# Patient Record
Sex: Male | Born: 1955 | Race: White | Hispanic: No | Marital: Married | Smoking: Former smoker
Health system: Southern US, Community
[De-identification: ages and names within clinical notes are randomized; demographics above are authoritative.]

## PROBLEM LIST (undated history)

## (undated) ENCOUNTER — Ambulatory Visit: Admission: EM

## (undated) DIAGNOSIS — M1990 Unspecified osteoarthritis, unspecified site: Secondary | ICD-10-CM

## (undated) DIAGNOSIS — F32A Depression, unspecified: Secondary | ICD-10-CM

## (undated) DIAGNOSIS — F419 Anxiety disorder, unspecified: Secondary | ICD-10-CM

## (undated) DIAGNOSIS — F329 Major depressive disorder, single episode, unspecified: Secondary | ICD-10-CM

## (undated) HISTORY — DX: Depression, unspecified: F32.A

## (undated) HISTORY — DX: Major depressive disorder, single episode, unspecified: F32.9

## (undated) HISTORY — DX: Unspecified osteoarthritis, unspecified site: M19.90

## (undated) HISTORY — PX: FOOT SURGERY: SHX648

## (undated) HISTORY — PX: BACK SURGERY: SHX140

## (undated) HISTORY — DX: Anxiety disorder, unspecified: F41.9

---

## 1998-03-28 ENCOUNTER — Other Ambulatory Visit: Admission: RE | Admit: 1998-03-28 | Discharge: 1998-03-28 | Payer: Self-pay | Admitting: Orthopedic Surgery

## 2001-01-03 ENCOUNTER — Emergency Department (HOSPITAL_COMMUNITY): Admission: EM | Admit: 2001-01-03 | Discharge: 2001-01-03 | Payer: Self-pay

## 2001-02-11 ENCOUNTER — Emergency Department (HOSPITAL_COMMUNITY): Admission: EM | Admit: 2001-02-11 | Discharge: 2001-02-11 | Payer: Self-pay | Admitting: Emergency Medicine

## 2001-02-11 ENCOUNTER — Encounter: Payer: Self-pay | Admitting: Emergency Medicine

## 2001-05-16 ENCOUNTER — Emergency Department (HOSPITAL_COMMUNITY): Admission: EM | Admit: 2001-05-16 | Discharge: 2001-05-16 | Payer: Self-pay | Admitting: Emergency Medicine

## 2001-07-03 ENCOUNTER — Encounter: Payer: Self-pay | Admitting: Internal Medicine

## 2001-07-03 ENCOUNTER — Ambulatory Visit (HOSPITAL_COMMUNITY): Admission: RE | Admit: 2001-07-03 | Discharge: 2001-07-03 | Payer: Self-pay | Admitting: Internal Medicine

## 2001-07-16 ENCOUNTER — Encounter: Admission: RE | Admit: 2001-07-16 | Discharge: 2001-07-16 | Payer: Self-pay | Admitting: Neurosurgery

## 2001-07-16 ENCOUNTER — Encounter: Payer: Self-pay | Admitting: Neurosurgery

## 2001-07-31 ENCOUNTER — Encounter: Payer: Self-pay | Admitting: Neurosurgery

## 2001-07-31 ENCOUNTER — Encounter: Admission: RE | Admit: 2001-07-31 | Discharge: 2001-07-31 | Payer: Self-pay | Admitting: Neurosurgery

## 2001-08-14 ENCOUNTER — Encounter: Payer: Self-pay | Admitting: Neurosurgery

## 2001-08-14 ENCOUNTER — Encounter: Admission: RE | Admit: 2001-08-14 | Discharge: 2001-08-14 | Payer: Self-pay | Admitting: Neurosurgery

## 2001-11-08 ENCOUNTER — Encounter: Payer: Self-pay | Admitting: *Deleted

## 2001-11-08 ENCOUNTER — Emergency Department (HOSPITAL_COMMUNITY): Admission: EM | Admit: 2001-11-08 | Discharge: 2001-11-09 | Payer: Self-pay | Admitting: *Deleted

## 2002-07-18 ENCOUNTER — Emergency Department (HOSPITAL_COMMUNITY): Admission: EM | Admit: 2002-07-18 | Discharge: 2002-07-19 | Payer: Self-pay | Admitting: Emergency Medicine

## 2002-07-18 ENCOUNTER — Encounter: Payer: Self-pay | Admitting: Emergency Medicine

## 2003-09-09 ENCOUNTER — Emergency Department (HOSPITAL_COMMUNITY): Admission: EM | Admit: 2003-09-09 | Discharge: 2003-09-09 | Payer: Self-pay | Admitting: Emergency Medicine

## 2004-11-09 ENCOUNTER — Ambulatory Visit (HOSPITAL_COMMUNITY): Admission: RE | Admit: 2004-11-09 | Discharge: 2004-11-09 | Payer: Self-pay | Admitting: Orthopaedic Surgery

## 2004-11-29 ENCOUNTER — Encounter: Admission: RE | Admit: 2004-11-29 | Discharge: 2004-11-29 | Payer: Self-pay | Admitting: Orthopaedic Surgery

## 2004-12-21 ENCOUNTER — Encounter: Admission: RE | Admit: 2004-12-21 | Discharge: 2004-12-21 | Payer: Self-pay | Admitting: Orthopaedic Surgery

## 2005-05-12 ENCOUNTER — Emergency Department (HOSPITAL_COMMUNITY): Admission: EM | Admit: 2005-05-12 | Discharge: 2005-05-12 | Payer: Self-pay | Admitting: Emergency Medicine

## 2005-09-26 ENCOUNTER — Emergency Department (HOSPITAL_COMMUNITY): Admission: EM | Admit: 2005-09-26 | Discharge: 2005-09-26 | Payer: Self-pay | Admitting: Emergency Medicine

## 2005-09-26 ENCOUNTER — Encounter: Admission: RE | Admit: 2005-09-26 | Discharge: 2005-09-26 | Payer: Self-pay | Admitting: Orthopaedic Surgery

## 2005-10-18 ENCOUNTER — Encounter: Admission: RE | Admit: 2005-10-18 | Discharge: 2005-10-18 | Payer: Self-pay | Admitting: Orthopaedic Surgery

## 2005-11-11 ENCOUNTER — Encounter: Admission: RE | Admit: 2005-11-11 | Discharge: 2005-11-11 | Payer: Self-pay | Admitting: Orthopaedic Surgery

## 2007-02-18 ENCOUNTER — Emergency Department (HOSPITAL_COMMUNITY): Admission: EM | Admit: 2007-02-18 | Discharge: 2007-02-18 | Payer: Self-pay | Admitting: Emergency Medicine

## 2007-03-16 ENCOUNTER — Ambulatory Visit (HOSPITAL_COMMUNITY): Admission: RE | Admit: 2007-03-16 | Discharge: 2007-03-16 | Payer: Self-pay | Admitting: Orthopedic Surgery

## 2007-03-19 ENCOUNTER — Ambulatory Visit (HOSPITAL_COMMUNITY): Admission: RE | Admit: 2007-03-19 | Discharge: 2007-03-20 | Payer: Self-pay | Admitting: Orthopedic Surgery

## 2007-10-14 ENCOUNTER — Ambulatory Visit (HOSPITAL_COMMUNITY): Admission: RE | Admit: 2007-10-14 | Discharge: 2007-10-14 | Payer: Self-pay | Admitting: Gastroenterology

## 2007-10-14 ENCOUNTER — Encounter (INDEPENDENT_AMBULATORY_CARE_PROVIDER_SITE_OTHER): Payer: Self-pay | Admitting: Gastroenterology

## 2007-12-29 ENCOUNTER — Ambulatory Visit (HOSPITAL_COMMUNITY): Admission: RE | Admit: 2007-12-29 | Discharge: 2007-12-29 | Payer: Self-pay | Admitting: Orthopaedic Surgery

## 2008-02-22 ENCOUNTER — Emergency Department (HOSPITAL_COMMUNITY): Admission: EM | Admit: 2008-02-22 | Discharge: 2008-02-22 | Payer: Self-pay | Admitting: Emergency Medicine

## 2008-03-06 IMAGING — CT CT CHEST W/ CM
2 of 3 series · 16 of 36 positions shown, 20 images · non-contrast
Comparison: No prior CT. Chest x-ray from 03/12/2007 has been reviewed.

CLINICAL DATA: Left lower lobe mass seen on chest x-ray from 03/12/2007
TECHNIQUE: Multidetector CT imaging of the chest was performed following the
standard protocol during bolus administration of intravenous contrast.

[Series 2: chest routine 5.0 b40f · axial · 0.81mm/px · z∈[-395,-35]mm · 13 of 81 slices shown, 17 images]
[im 6/81  mediastinal]
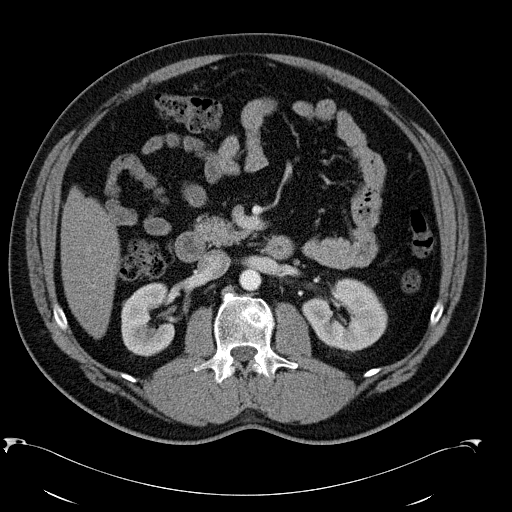
[im 6/81  lung]
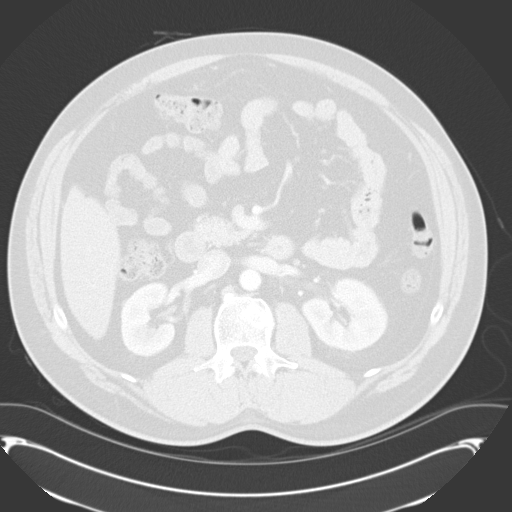
[im 12/81  lung]
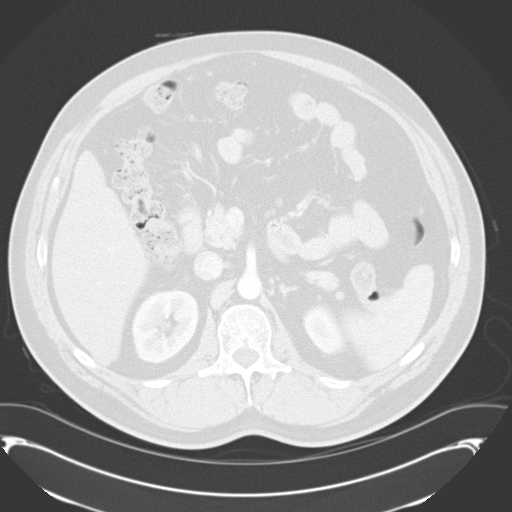
[im 18/81  lung]
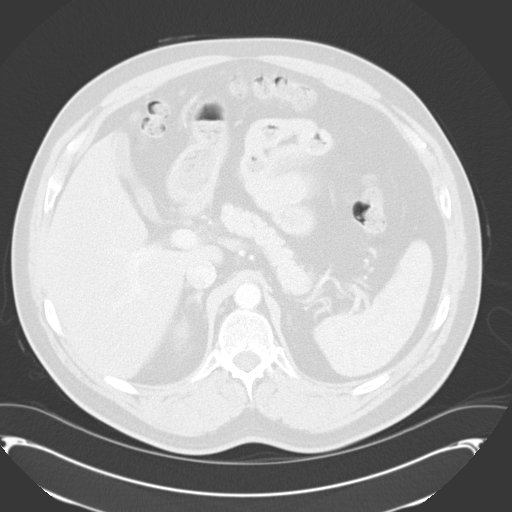
[im 24/81  lung]
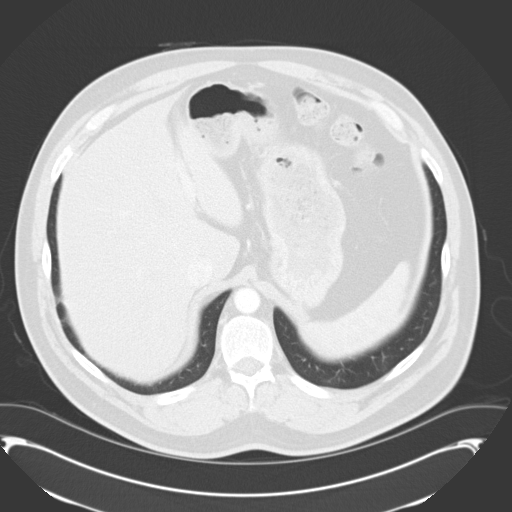
[im 30/81  mediastinal]
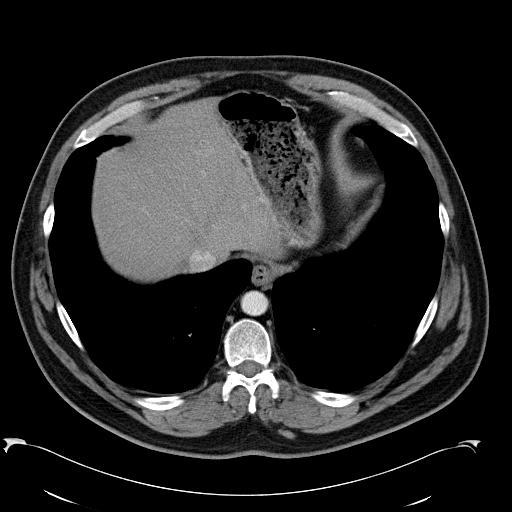
[im 30/81  lung]
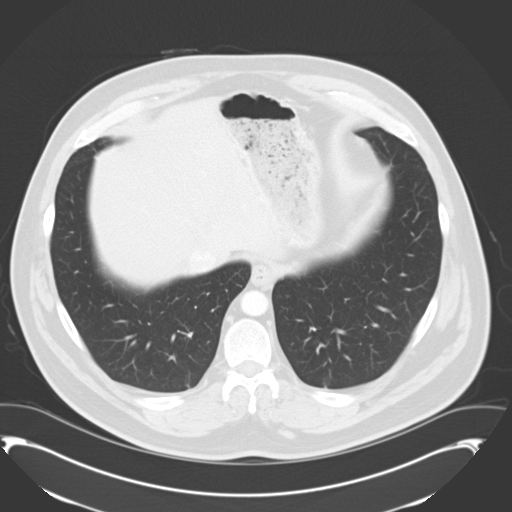
[im 36/81  lung]
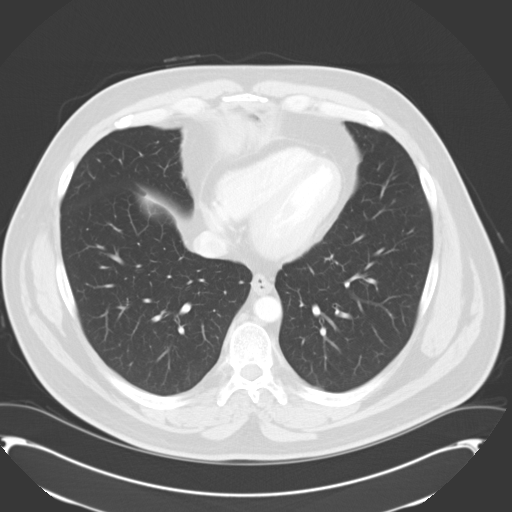
[im 42/81  lung]
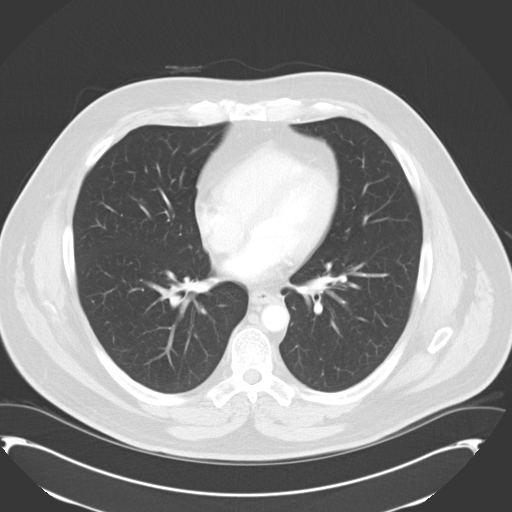
[im 48/81  lung]
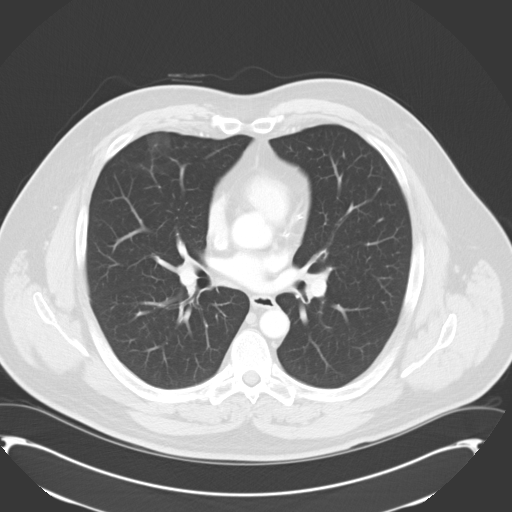
[im 54/81  mediastinal]
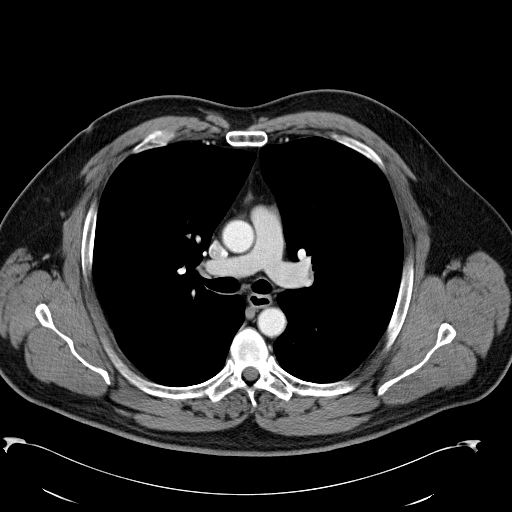
[im 54/81  lung]
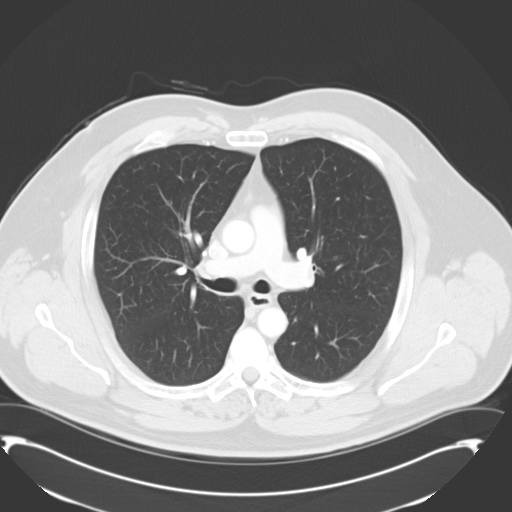
[im 60/81  lung]
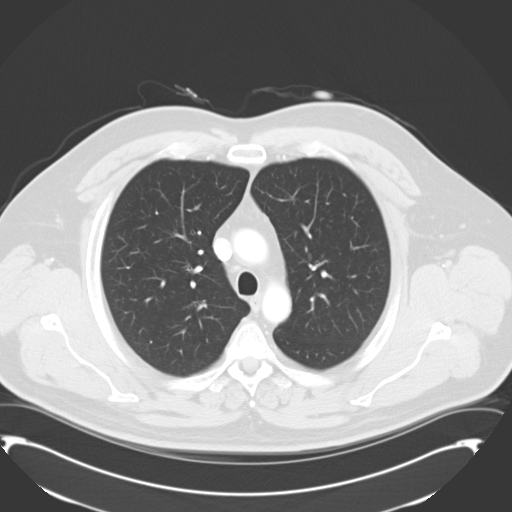
[im 66/81  lung]
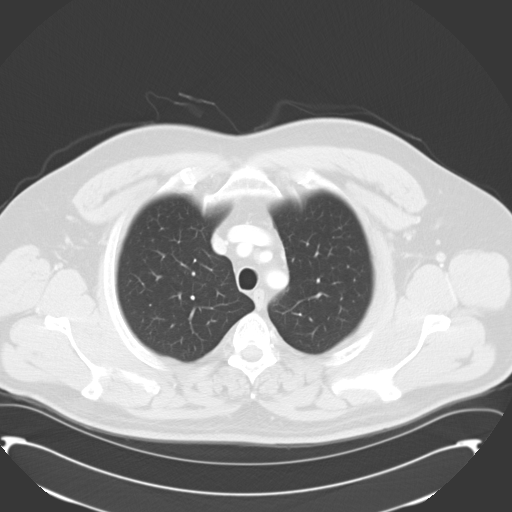
[im 72/81  lung]
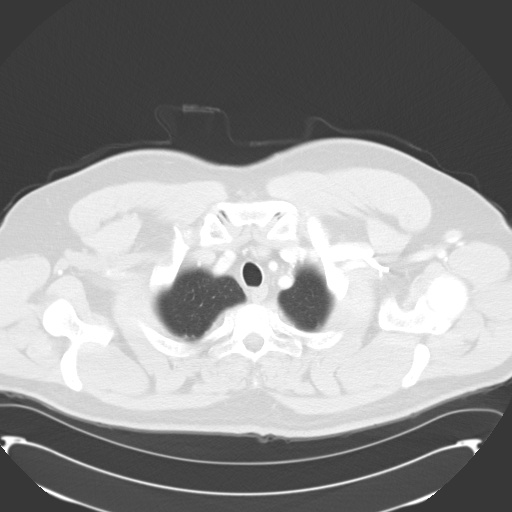
[im 78/81  mediastinal]
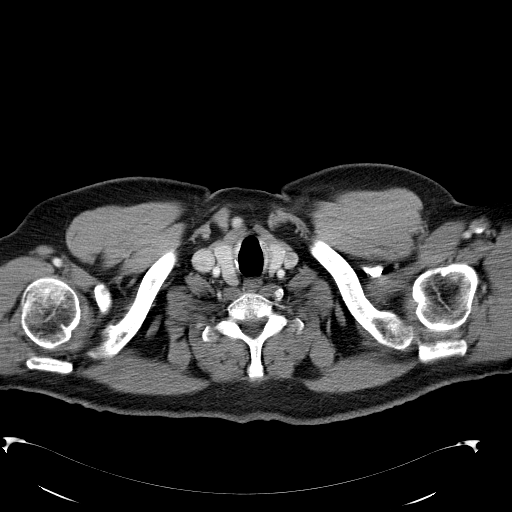
[im 78/81  lung]
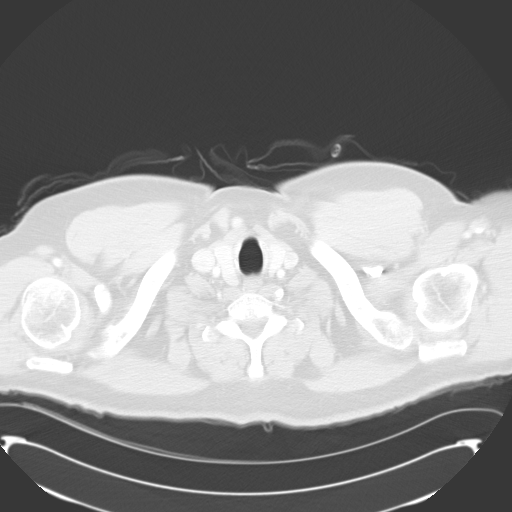

[Series 602: <mpr thick range> · coronal · 0.81mm/px · 3 of 136 slices shown]
[im 28/136  lung]
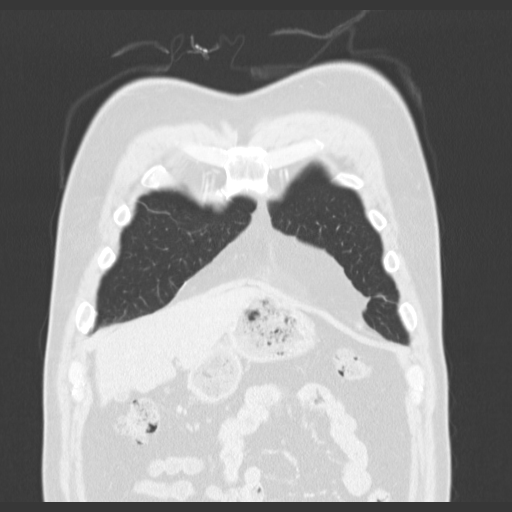
[im 55/136  lung]
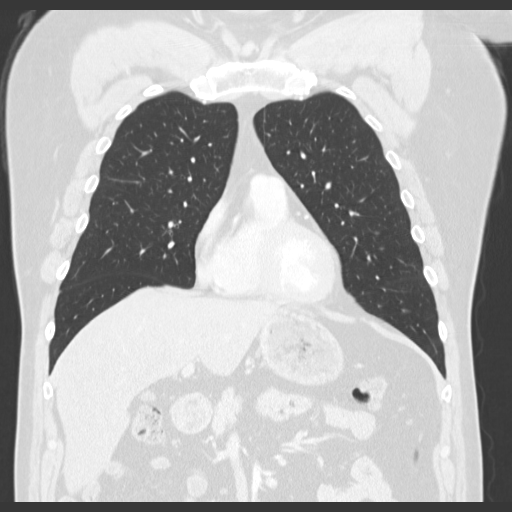
[im 82/136  lung]
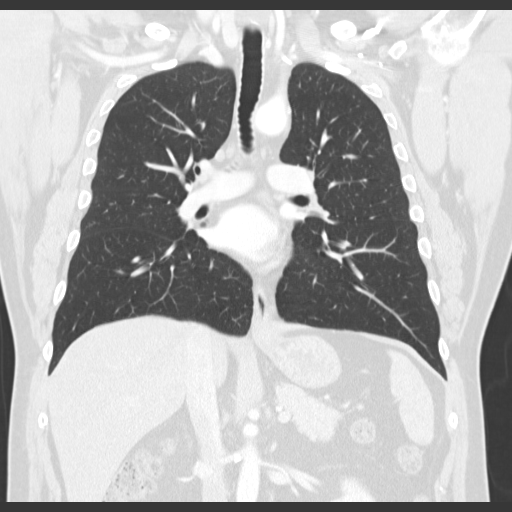

[16 of 36 positions shown; findings below may reference images not displayed]

Contrast:  80 cc Omnipaque 300

CHEST CT WITH CONTRAST:

No axillary, mediastinal, or hilar adenopathy. The heart size is normal. There
is no pericardial or pleural effusion.

The lungs are clear bilaterally. Specifically, there is no evidence for a mass
lesion at the cardiac apex as suggested on the recent chest x-ray. This opacity
is seen to represent juxtacardiac fat on the CT scan.
IMPRESSION: Unremarkable study. The opacity seen at the cardiac apex on the recent chest
x-ray represents a fat pad.

## 2009-04-22 ENCOUNTER — Emergency Department (HOSPITAL_COMMUNITY): Admission: EM | Admit: 2009-04-22 | Discharge: 2009-04-22 | Payer: Self-pay | Admitting: Emergency Medicine

## 2010-10-23 NOTE — Op Note (Signed)
Richard Neal, Richard Neal              ACCOUNT NO.:  [id number]   MEDICAL RECORD NO.:  [id number]          PATIENT TYPE:  AMB   LOCATION:  ENDO                         FACILITY:  [facility]   PHYSICIAN:  John C. Madilyn Fireman, M.D.    DATE OF BIRTH:  02/02/1956   DATE OF PROCEDURE:  10/14/2007  DATE OF DISCHARGE:                               OPERATIVE REPORT   INDICATIONS FOR PROCEDURE:  Rectal bleeding in a 55 year old patient.   PROCEDURE:  The patient was placed in the left lateral decubitus  position and placed on the pulse monitor with continuous low-flow oxygen  delivered via nasal cannula.  He was sedated with 125 mcg IV fentanyl  and 12.5 mg IV Versed.  Olympus video colonoscope was inserted into the  rectum and advanced to the cecum, confirmed by transillumination of  McBurney's point and visualization of ileocecal valve and appendiceal  orifice.  Prep was excellent.  The cecum, ascending, transverse,  descending, sigmoid all appeared normal with no masses, polyps,  diverticula, or other mucosal abnormalities.  In the rectum, there was a  1.2-cm polyp at approximately 15 cm that was removed by snare into  pieces that appeared to be completely fulgurated.  There were no  significant internal hemorrhoids, but there was a mildly thrombosed  external hemorrhoid.  The scope was then withdrawn, and the patient  returned to the recovery room in stable condition.  He tolerated the  procedure well, and there were no immediate complication.   IMPRESSION:  Rectal polyp.   PLAN:  Await histology to determine method and interval for future colon  screening.           ______________________________  Everardo All. Madilyn Fireman, M.D.     JCH/MEDQ  D:  10/14/2007  T:  10/15/2007  Job:  [id number]   cc:   Aida Puffer

## 2010-10-23 NOTE — Op Note (Signed)
Richard Neal, Richard Neal              ACCOUNT NO.:  [id number]   MEDICAL RECORD NO.:  [id number]          PATIENT TYPE:  OIB   LOCATION:  1601                         [facility]ACILITY:  [facility]   PHYSICIAN:  Georges Lynch. Gioffre, M.D.DATE OF BIRTH:  [birth year]   DATE OF PROCEDURE:  03/19/2007  DATE OF DISCHARGE:                               OPERATIVE REPORT   SURGEON:  Georges Lynch. Darrelyn Hillock, M.D.   ASSISTANT:  Jene Every, MD   PREOPERATIVE DIAGNOSES:  1. 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  2. Central and to the right large herniated disk L4-5 on the right      hip.   POSTOPERATIVE DIAGNOSES:  1. 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  2. Central and to the right large herniated disk L4-5 on the right      hip.   OPERATION:  1. Decompression of the lateral recess for recess stenosis at L4-5 on      the right.  2. Microdiskectomy L4-L5 on the right.  3. Foraminotomies for the L4 and 5 roots on the right.   PROCEDURE:  Under general anesthesia the patient on spinal frame,  routine orthopedic prep and draping the back carried out.  He had 2  grams of IV Ancef preop.  Two needles were placed in back, x-ray was  taken for localization purposes.  Second x-ray then was finally taken to  verify we were at the exact position we needed to be.  At this time the  incision was carried down to the lamina of L4-L5.  We identified the  sacrum and L4-5.  We separated the muscle was from the lamina and  spinous process with a Cobb elevator.  We then inserted the self-  retaining McCullough retractors.  Following that we then went on and did  a hemilaminectomy in the usual fashion utilizing the double-action  rongeur and Kerrison rongeurs.  We removed ligamentum flavum with great  care not to injure underlying dura.  The microscope was used during the  procedure.  We then went out decompressed the lateral recess, made sure  we had a nerve root  completely decompress.  We went down into the  foramina of the five root and completely decompressed it as well.  We  protected the dura with a cottonoid and the D'Errico retractor.  We  cauterized lateral recess veins with the bipolar.  We had a nice far  lateral exposure.  We went down, made a cruciate incision in posterior  longitudinal ligament and carried out the microdiskectomy.  We went out  laterally and made sure that we had the foramen cleared as well.  We  then utilized a hockey-stick to further decompress subligamentous disk  material into the space.  Went in the space and removed the disk.  We  completed the procedure, the nerve root was freely movable, so was the  dura.  We were able to pass a hockey-stick proximally and distally out  the foramina to make sure the roots now were free.  The lateral recess  was free as well.  We thoroughly irrigated  the area, loosely  applied some thrombin-soaked Gelfoam, closed the wound in layers in  usual fashion except I left a small deep distal portion of the wound  open for drainage purposes.  Skin was closed with metal staples.  Sterile Neosporin dressing was applied.  He had 2 grams of IV Ancef  preop.  He had 30 mg IV of Toradol.           ______________________________  Georges Lynch. Darrelyn Hillock, M.D.     RAG/MEDQ  D:  03/19/2007  T:  03/20/2007  Job:  [id number]

## 2010-12-30 ENCOUNTER — Emergency Department (HOSPITAL_COMMUNITY)
Admission: EM | Admit: 2010-12-30 | Discharge: 2010-12-30 | Disposition: A | Discharge status: Home / Self Care | Payer: BC Managed Care – PPO | Attending: Emergency Medicine | Admitting: Emergency Medicine

## 2010-12-30 DIAGNOSIS — G8929 Other chronic pain: Secondary | ICD-10-CM | POA: Insufficient documentation

## 2010-12-30 DIAGNOSIS — K644 Residual hemorrhoidal skin tags: Secondary | ICD-10-CM | POA: Insufficient documentation

## 2010-12-30 DIAGNOSIS — M549 Dorsalgia, unspecified: Secondary | ICD-10-CM | POA: Insufficient documentation

## 2010-12-30 DIAGNOSIS — Z87891 Personal history of nicotine dependence: Secondary | ICD-10-CM | POA: Insufficient documentation

## 2010-12-30 LAB — POCT I-STAT, CHEM 8
Creatinine, Ser: 1.2 mg/dL (ref 0.50–1.35)
Potassium: 4.5 mEq/L (ref 3.5–5.1)
Sodium: 142 mEq/L (ref 135–145)

## 2010-12-30 LAB — CBC
HCT: 39.1 % (ref 39.0–52.0)
Hemoglobin: 13.4 g/dL (ref 13.0–17.0)
MCHC: 34.3 g/dL (ref 30.0–36.0)
MCV: 88.7 fL (ref 78.0–100.0)
Platelets: 161 10*3/uL (ref 150–400)
RBC: 4.41 MIL/uL (ref 4.22–5.81)
WBC: 6.8 10*3/uL (ref 4.0–10.5)

## 2010-12-30 LAB — DIFFERENTIAL
Basophils Relative: 0 % (ref 0–1)
Eosinophils Absolute: 0.1 10*3/uL (ref 0.0–0.7)
Eosinophils Relative: 2 % (ref 0–5)
Lymphs Abs: 1.8 10*3/uL (ref 0.7–4.0)
Monocytes Relative: 8 % (ref 3–12)
Neutrophils Relative %: 64 % (ref 43–77)

## 2011-03-21 LAB — TYPE AND SCREEN: ABO/RH(D): A POS

## 2011-03-21 LAB — ABO/RH: ABO/RH(D): A POS

## 2011-03-21 LAB — PROTIME-INR
INR: 1
Prothrombin Time: 13.4

## 2011-03-22 LAB — URINALYSIS, ROUTINE W REFLEX MICROSCOPIC
Bilirubin Urine: NEGATIVE
Ketones, ur: NEGATIVE
Nitrite: NEGATIVE
Urobilinogen, UA: 0.2

## 2011-03-22 LAB — HERPES SIMPLEX VIRUS CULTURE: Culture: NOT DETECTED

## 2011-03-22 LAB — WET PREP, GENITAL
Trich, Wet Prep: NONE SEEN
Yeast Wet Prep HPF POC: NONE SEEN

## 2012-05-09 ENCOUNTER — Ambulatory Visit (INDEPENDENT_AMBULATORY_CARE_PROVIDER_SITE_OTHER): Payer: BC Managed Care – PPO | Admitting: Family Medicine

## 2012-05-09 ENCOUNTER — Ambulatory Visit: Payer: BC Managed Care – PPO

## 2012-05-09 VITALS — BP 110/74 | HR 69 | Temp 98.1°F | Resp 16 | Ht 71.0 in | Wt 244.6 lb

## 2012-05-09 DIAGNOSIS — R1310 Dysphagia, unspecified: Secondary | ICD-10-CM

## 2012-05-09 DIAGNOSIS — R05 Cough: Secondary | ICD-10-CM

## 2012-05-09 DIAGNOSIS — R059 Cough, unspecified: Secondary | ICD-10-CM

## 2012-05-09 DIAGNOSIS — R0989 Other specified symptoms and signs involving the circulatory and respiratory systems: Secondary | ICD-10-CM

## 2012-05-09 DIAGNOSIS — J40 Bronchitis, not specified as acute or chronic: Secondary | ICD-10-CM

## 2012-05-09 DIAGNOSIS — J019 Acute sinusitis, unspecified: Secondary | ICD-10-CM

## 2012-05-09 MED ORDER — AZITHROMYCIN 250 MG PO TABS: ORAL_TABLET | ORAL | Status: DC

## 2012-05-09 MED ORDER — BENZONATATE 200 MG PO CAPS: 200.0000 mg | ORAL_CAPSULE | Freq: Two times a day (BID) | ORAL | Status: DC | PRN

## 2012-05-09 NOTE — Progress Notes (Signed)
[facility]:  Office Visit  Chief Complaint:  Chief Complaint  Patient presents with  . Cough    tried otc mucinex-d and cough syrup  . Headache  . Generalized Body Aches  . feels like blockage in esophagus when eating    HPI: Richard Neal is a 56 y.o. male who complains of  5 days history of nasal and chest congestion, coughing with green mucus, sinus pain  and headaches across eyes. Tried otc Mucinex without releif. He is a Investment banker, operational. Chills. Denies tobacco use, quit 1999. Deneis SOB, wheezing. Already has had flu   Stomach problems, sometimes food will not go down throat and he has to drink water and then gets choked up. He does chew his food and not taking big bites. Twice a week this has since June , for last 6 months. He has heart burn every now and then. Has problems with just swallowing solid foods, not with liquids and denis any SOB, wheezing, CP with this. Denies nausea, vomiting, abd pain.   History reviewed. No pertinent past medical history. Past Surgical History  Procedure Date  . Foot surgery   . Back surgery 2007 and 2010   History   Social History  . Marital Status: Married    Spouse Name: N/A    Number of Children: N/A  . Years of Education: N/A   Social History Main Topics  . Smoking status: Former Smoker    Types: Cigarettes    Quit date: 05/10/1999  . Smokeless tobacco: None  . Alcohol Use: Yes     Comment: occasionally  . Drug Use: None  . Sexually Active: Yes   Other Topics Concern  . None   Social History Narrative  . None   Family History  Problem Relation Age of Onset  . Clotting disorder Mother    No Known Allergies Prior to Admission medications   Medication Sig Start Date End Date Taking? Authorizing Provider  cyclobenzaprine (FLEXERIL) 10 MG tablet Take 10 mg by mouth as needed.   Yes Historical Provider, MD  oxyCODONE-acetaminophen (PERCOCET/ROXICET) 5-325 MG per tablet Take 1 tablet by mouth as  needed.   Yes Historical Provider, MD     ROS: The patient denies fevers, chills, night sweats, unintentional weight loss, chest pain, palpitations, wheezing, dyspnea on exertion, nausea, vomiting, abdominal pain, dysuria, hematuria, melena, numbness, weakness, or tingling.   All other systems have been reviewed and were otherwise negative with the exception of those mentioned in the HPI and as above.    PHYSICAL EXAM: Filed Vitals:   05/09/12 1155  BP: 110/74  Pulse: 69  Temp: 98.1 F (36.7 C)  Resp: 16   Filed Vitals:   05/09/12 1155  Height: 5\' 11"  (1.803 m)  Weight: 244 lb 10.1 oz (110.963 kg)   Body mass index is 34.12 kg/(m^2).  General: Alert, no acute distress HEENT:  Normocephalic, atraumatic, oropharynx patent. + sinus tenderness, erythematous throat, no exudates, TM nl, Cardiovascular:  Regular rate and rhythm, no rubs murmurs or gallops.  No Carotid bruits, radial pulse intact. No pedal edema.  Respiratory: Clear to auscultation bilaterally.  No wheezes, rales, or rhonchi.  No cyanosis, no use of accessory musculature GI: No organomegaly, abdomen is soft and non-tender, positive bowel sounds.  No masses. Skin: No rashes. Neurologic: Facial musculature symmetric. Psychiatric: Patient is appropriate throughout our interaction. Lymphatic: No cervical lymphadenopathy Musculoskeletal: Gait intact.   LABS:    EKG/XRAY:   Primary read  interpreted by Dr. Conley Rolls at [facility]. No acute cardiopulmonary process + peribronchial thickening that has been present in prior xray   ASSESSMENT/PLAN: Encounter Diagnoses  Name Primary?  . Chest congestion Yes  . Cough   . Dysphagia   . Acute sinusitis    Acute sinusitis, bronchitis 1. Z pack, tessalon perles, hyrdromet syrup 2. Will refer to GI for dysphagia with solids F/u prn   Havah Ammon PHUONG, DO 05/10/2012 11:27 AM

## 2014-07-13 ENCOUNTER — Encounter: Payer: Self-pay | Admitting: Family Medicine

## 2014-07-13 ENCOUNTER — Ambulatory Visit (INDEPENDENT_AMBULATORY_CARE_PROVIDER_SITE_OTHER): Payer: Self-pay | Admitting: Family Medicine

## 2014-07-13 ENCOUNTER — Ambulatory Visit (INDEPENDENT_AMBULATORY_CARE_PROVIDER_SITE_OTHER): Payer: BLUE CROSS/BLUE SHIELD | Admitting: Family Medicine

## 2014-07-13 VITALS — BP 130/64 | HR 72 | Temp 99.0°F | Resp 16 | Ht 70.25 in | Wt 239.0 lb

## 2014-07-13 DIAGNOSIS — M7552 Bursitis of left shoulder: Secondary | ICD-10-CM

## 2014-07-13 DIAGNOSIS — Z0000 Encounter for general adult medical examination without abnormal findings: Secondary | ICD-10-CM

## 2014-07-13 MED ORDER — PREDNISONE 20 MG PO TABS
40.0000 mg | ORAL_TABLET | Freq: Every day | ORAL | Status: DC
Start: 2014-07-13 — End: 2014-11-26

## 2014-07-13 NOTE — Progress Notes (Signed)
DOT form completed  No disqualifying factors  glasses required

## 2014-07-13 NOTE — Progress Notes (Signed)
° °  Subjective:  This chart was scribed for Elvina SidleKurt Lauenstein, MD, by Veverly FellsHatice Demirci,scribe, at [facility].  This patient was seen in room 9 and the patient's care was started at 4:04 PM.     Patient ID: Richard JacksonJimmy C Neal, male    DOB: 24-Dec-1955, 59 y.o.   MRN: [id number]  HPI HPI Comments: Richard JacksonJimmy C Neal is a 59 y.o. male who presents to [facility] complaining of intermittent shoulder pain that radiates to his neck for the past 6-8 months. He is also here for a DOT physical.  Patient denies smoking.  Patient has had lumbar disk surgery. He has no other complaints today. Patient is a Hydrologistcrane operator.     There are no active problems to display for this patient.  History reviewed. No pertinent past medical history. Past Surgical History  Procedure Laterality Date   Foot surgery     Back surgery  2007 and 2010   No Known Allergies Prior to Admission medications   Medication Sig Start Date End Date Taking? Authorizing Provider  azithromycin (ZITHROMAX) 250 MG tablet Take 2 tabs PO now then 1 tab PO daily for the next 4 days Patient not taking: Reported on 07/13/2014 05/09/12   Thao P Le, DO  benzonatate (TESSALON) 200 MG capsule Take 1 capsule (200 mg total) by mouth 2 (two) times daily as needed for cough. Patient not taking: Reported on 07/13/2014 05/09/12   Thao P Le, DO  cyclobenzaprine (FLEXERIL) 10 MG tablet Take 10 mg by mouth as needed.    Historical Provider, MD  oxyCODONE-acetaminophen (PERCOCET/ROXICET) 5-325 MG per tablet Take 1 tablet by mouth as needed.    Historical Provider, MD   History   Social History   Marital Status: Married    Spouse Name: N/A    Number of Children: N/A   Years of Education: N/A   Occupational History   Not on file.   Social History Main Topics   Smoking status: Former Smoker    Types: Cigarettes    Quit date: 05/10/1999   Smokeless tobacco: Not on file   Alcohol Use: Yes     Comment:  occasionally   Drug Use: Not on file   Sexual Activity: Yes   Other Topics Concern   Not on file   Social History Narrative        Review of Systems  Constitutional: Negative for fever and chills.  Musculoskeletal: Positive for myalgias and neck pain.       Objective:   Physical Exam  Filed Vitals:   07/13/14 1509  BP: 130/64  Pulse: 72  Temp: 99 F (37.2 C)  TempSrc: Oral  Resp: 16  Height: 5' 10.25" (1.784 m)  Weight: 239 lb (108.41 kg)  SpO2: 97%  Patient has full neck range of motion with no tenderness about his neck. Patient has complete range of motion and good strength with no muscle wasting in the left upper extremity. His reflexes are normal in the biceps and triceps group  DOT form also completed.    Assessment & Plan:    This chart was scribed in my presence and reviewed by me personally.    ICD-9-CM ICD-10-CM   1. Shoulder bursitis, left 726.10 M75.52 predniSONE (DELTASONE) 20 MG tablet   Dot form completed   Signed, Elvina SidleKurt Lauenstein, MD

## 2014-11-26 ENCOUNTER — Ambulatory Visit (INDEPENDENT_AMBULATORY_CARE_PROVIDER_SITE_OTHER): Payer: Commercial Managed Care - HMO | Admitting: Family Medicine

## 2014-11-26 VITALS — BP 120/60 | HR 85 | Temp 98.1°F | Resp 16 | Ht 71.5 in | Wt 240.0 lb

## 2014-11-26 DIAGNOSIS — R0789 Other chest pain: Secondary | ICD-10-CM

## 2014-11-26 DIAGNOSIS — G4700 Insomnia, unspecified: Secondary | ICD-10-CM | POA: Diagnosis not present

## 2014-11-26 DIAGNOSIS — F418 Other specified anxiety disorders: Secondary | ICD-10-CM

## 2014-11-26 DIAGNOSIS — L509 Urticaria, unspecified: Secondary | ICD-10-CM | POA: Diagnosis not present

## 2014-11-26 DIAGNOSIS — M5136 Other intervertebral disc degeneration, lumbar region: Secondary | ICD-10-CM

## 2014-11-26 DIAGNOSIS — F329 Major depressive disorder, single episode, unspecified: Secondary | ICD-10-CM

## 2014-11-26 DIAGNOSIS — F419 Anxiety disorder, unspecified: Secondary | ICD-10-CM

## 2014-11-26 LAB — POCT CBC
Granulocyte percent: 72.7 %G (ref 37–80)
HEMATOCRIT: 42.8 % — AB (ref 43.5–53.7)
HEMOGLOBIN: 14.2 g/dL (ref 14.1–18.1)
Lymph, poc: 2 (ref 0.6–3.4)
MCH, POC: 29.7 pg (ref 27–31.2)
MCHC: 33.2 g/dL (ref 31.8–35.4)
MCV: 89.5 fL (ref 80–97)
MID (CBC): 0.5 (ref 0–0.9)
MPV: 7.6 fL (ref 0–99.8)
POC Granulocyte: 6.8 (ref 2–6.9)
POC LYMPH %: 21.5 % (ref 10–50)
POC MID %: 5.8 %M (ref 0–12)
Platelet Count, POC: 201 10*3/uL (ref 142–424)
RBC: 4.79 M/uL (ref 4.69–6.13)
RDW, POC: 13 %
WBC: 9.3 10*3/uL (ref 4.6–10.2)

## 2014-11-26 LAB — GLUCOSE, POCT (MANUAL RESULT ENTRY): POC GLUCOSE: 72 mg/dL (ref 70–99)

## 2014-11-26 MED ORDER — CITALOPRAM HYDROBROMIDE 20 MG PO TABS: 20.0000 mg | ORAL_TABLET | Freq: Every day | ORAL | Status: DC

## 2014-11-26 MED ORDER — METHYLPREDNISOLONE SODIUM SUCC 125 MG IJ SOLR: 125.0000 mg | Freq: Once | INTRAMUSCULAR | Status: DC

## 2014-11-26 MED ORDER — RANITIDINE HCL 150 MG PO TABS: 150.0000 mg | ORAL_TABLET | Freq: Two times a day (BID) | ORAL | Status: DC

## 2014-11-26 MED ORDER — EPINEPHRINE 0.3 MG/0.3ML IJ SOAJ: 0.3000 mg | Freq: Once | INTRAMUSCULAR | Status: AC

## 2014-11-26 MED ORDER — LORATADINE 10 MG PO TABS: 10.0000 mg | ORAL_TABLET | Freq: Every day | ORAL | Status: DC

## 2014-11-26 MED ORDER — ALPRAZOLAM 0.5 MG PO TABS: 0.5000 mg | ORAL_TABLET | Freq: Two times a day (BID) | ORAL | Status: DC | PRN

## 2014-11-26 MED ORDER — CETIRIZINE HCL 10 MG PO TABS
10.0000 mg | ORAL_TABLET | Freq: Once | ORAL | Status: AC
  Administered 2014-11-26: 10 mg via ORAL

## 2014-11-26 MED ORDER — DIPHENHYDRAMINE HCL 25 MG PO CAPS
25.0000 mg | ORAL_CAPSULE | Freq: Once | ORAL | Status: AC
  Administered 2014-11-26: 25 mg via ORAL

## 2014-11-26 MED ORDER — PREDNISONE 20 MG PO TABS: ORAL_TABLET | ORAL | Status: DC

## 2014-11-26 MED ORDER — RANITIDINE HCL 150 MG PO TABS
150.0000 mg | ORAL_TABLET | Freq: Once | ORAL | Status: AC
  Administered 2014-11-26: 150 mg via ORAL

## 2014-11-26 MED ORDER — METHYLPREDNISOLONE ACETATE 80 MG/ML IJ SUSP
120.0000 mg | Freq: Once | INTRAMUSCULAR | Status: AC
  Administered 2014-11-26: 120 mg via INTRAMUSCULAR

## 2014-11-26 NOTE — Patient Instructions (Signed)
1.  Benadryl 25mg  1-2 at bedtime for itching and insomnia.  Hives Hives are itchy, red, swollen areas of the skin. They can vary in size and location on your body. Hives can come and go for hours or several days (acute hives) or for several weeks (chronic hives). Hives do not spread from person to person (noncontagious). They may get worse with scratching, exercise, and emotional stress. CAUSES   Allergic reaction to food, additives, or drugs.  Infections, including the common cold.  Illness, such as vasculitis, lupus, or thyroid disease.  Exposure to sunlight, heat, or cold.  Exercise.  Stress.  Contact with chemicals. SYMPTOMS   Red or white swollen patches on the skin. The patches may change size, shape, and location quickly and repeatedly.  Itching.  Swelling of the hands, feet, and face. This may occur if hives develop deeper in the skin. DIAGNOSIS  Your caregiver can usually tell what is wrong by performing a physical exam. Skin or blood tests may also be done to determine the cause of your hives. In some cases, the cause cannot be determined. TREATMENT  Mild cases usually get better with medicines such as antihistamines. Severe cases may require an emergency epinephrine injection. If the cause of your hives is known, treatment includes avoiding that trigger.  HOME CARE INSTRUCTIONS   Avoid causes that trigger your hives.  Take antihistamines as directed by your caregiver to reduce the severity of your hives. Non-sedating or low-sedating antihistamines are usually recommended. Do not drive while taking an antihistamine.  Take any other medicines prescribed for itching as directed by your caregiver.  Wear loose-fitting clothing.  Keep all follow-up appointments as directed by your caregiver. SEEK MEDICAL CARE IF:   You have persistent or severe itching that is not relieved with medicine.  You have painful or swollen joints. SEEK IMMEDIATE MEDICAL CARE IF:   You  have a fever.  Your tongue or lips are swollen.  You have trouble breathing or swallowing.  You feel tightness in the throat or chest.  You have abdominal pain. These problems may be the first sign of a life-threatening allergic reaction. Call your local emergency services (911 in [place] MAKE SURE YOU:   Understand these instructions.  Will watch your condition.  Will get help right away if you are not doing well or get worse. Document Released: 05/27/2005 Document Revised: 06/01/2013 Document Reviewed: 08/20/2011 [facility] Patient Information 2015 [place], [place]. This information is not intended to replace advice given to you by your health care provider. Make sure you discuss any questions you have with your health care provider.

## 2014-11-26 NOTE — Progress Notes (Signed)
Subjective:    Patient ID: Richard Neal, male    DOB: 05/18/1956, 59 y.o.   MRN: [id number] This chart was scribed for Nilda Simmer, MD by Jolene Provost, Medical Scribe. This patient was seen in Room 14 and the patient's care was started at 3:01 PM.  Chief Complaint  Patient presents with  . Allergic Reaction  . Depression    Per screening    HPI HPI Comments: DIAGO CARIS is a 59 y.o. male who presents to [facility] complaining of an itch rash all over his body. Pt states he was bitten by a bug two weeks ago, but the bite mark stopped swelling within two days. Pt states he has not taken any abnormal medications. Pt states he took one percocet yesterday, but states he has been taking percocet for years. Pt took a Claritin yesterday after onset of rash without improvement. Pt states he has not eaten any new foods within the last 48 hours. Pt has not changed his shampoo or detergent. Pt also states he has been a little dizzy today with some mild chest and abdominal pain epigastric region. Pt denies SI.  Denies SOB; having some mild facial swelling; tongue feels slightly swollen; throat is itchy.  No throat swelling.  No difficulties breathing.  Pt states his wife passed away on the 23d of last month. He admits to excessive sadness and anxiety.  He is also suffering with insomnia.  Really struggling emotionally.  Having mild panic attacks at work. Previously treated for anxiety with Xanax.  Wife took Valium and Lexapro for anxiety and depression.  Denies SI/HI.    Pt also states he needs a refill of his percocet medication for chronic lower back pain.  Was previously managed by orthopedist for lower back pain but insurance has changed and orthopedist is out of network; requesting referral to new orthopedist for ongoing management of lower back pain.    Pt does not have a PCP since change in insurance; previous PCP is also out of network; would like to establish at [facility].   Review of Systems    Constitutional: Negative for fever, chills, diaphoresis and fatigue.  HENT: Positive for facial swelling. Negative for congestion, drooling, postnasal drip, rhinorrhea, sore throat, trouble swallowing and voice change.   Eyes: Negative for photophobia and visual disturbance.  Respiratory: Negative for cough and shortness of breath.   Cardiovascular: Positive for chest pain. Negative for palpitations and leg swelling.  Gastrointestinal: Positive for abdominal pain. Negative for nausea, vomiting, diarrhea, constipation, blood in stool, abdominal distention, anal bleeding and rectal pain.  Musculoskeletal: Positive for myalgias and back pain.  Skin: Positive for color change and rash. Negative for pallor and wound.  Neurological: Positive for dizziness and light-headedness. Negative for weakness, numbness and headaches.  Psychiatric/Behavioral: Positive for sleep disturbance and dysphoric mood. Negative for suicidal ideas and self-injury. The patient is nervous/anxious.    Past Medical History  Diagnosis Date  . Arthritis     degenerative disc disease lumbar  . Anxiety   . Depression    Past Surgical History  Procedure Laterality Date  . Foot surgery    . Back surgery  2007 and 2010   No Known Allergies History   Social History  . Marital Status: Married    Spouse Name: N/A  . Number of Children: N/A  . Years of Education: N/A   Occupational History  . Not on file.   Social History Main Topics  . Smoking status: Former  Smoker    Types: Cigarettes    Quit date: 05/10/1999  . Smokeless tobacco: Not on file  . Alcohol Use: Yes     Comment: occasionally  . Drug Use: Not on file  . Sexual Activity: Yes   Other Topics Concern  . Not on file   Social History Narrative   Family History  Problem Relation Age of Onset  . Clotting disorder Mother        Objective:   Physical Exam  Constitutional: He is oriented to person, place, and time. He appears well-developed and  well-nourished. No distress.  obese  HENT:  Head: Normocephalic and atraumatic.  Right Ear: Tympanic membrane, external ear and ear canal normal.  Left Ear: Tympanic membrane, external ear and ear canal normal.  Nose: Nose normal.  Mouth/Throat: Uvula is midline, oropharynx is clear and moist and mucous membranes are normal.  Mild facial swelling along chin/mandible; no tongue swelling.  Eyes: Conjunctivae and EOM are normal. Pupils are equal, round, and reactive to light.  Neck: Normal range of motion. Neck supple. Carotid bruit is not present. No thyromegaly present.  Cardiovascular: Normal rate, regular rhythm, normal heart sounds and intact distal pulses.  Exam reveals no gallop and no friction rub.   No murmur heard. Pulmonary/Chest: Effort normal and breath sounds normal. No respiratory distress. He has no wheezes. He has no rales.  Abdominal: Soft. Bowel sounds are normal. He exhibits no distension and no mass. There is tenderness in the epigastric area. There is no rebound, no guarding and no CVA tenderness. No hernia.  TTP in epigastric region and lower abdomen.   Musculoskeletal: Normal range of motion.  Lymphadenopathy:    He has no cervical adenopathy.  Neurological: He is alert and oriented to person, place, and time. No cranial nerve deficit. Coordination normal.  Skin: Skin is warm and dry. Rash noted. Rash is urticarial. He is not diaphoretic.  Urticarial rash along B axilla, lower waistline.  No involvement of extremities.   Psychiatric: He has a normal mood and affect. His behavior is normal.  Nursing note and vitals reviewed.  DEPOMEDROL  IM, ZYRTEC  PO, ZANTAC  PO, BENADRYL  PO IN OFFICE.  Results for orders placed or performed in visit on 11/26/14  POCT CBC  Result Value Ref Range   WBC 9.3 4.6 - 10.2 K/uL   Lymph, poc 2.0 0.6 - 3.4   POC LYMPH PERCENT 21.5 10 - 50 %L   MID (cbc) 0.5 0 - 0.9   POC MID % 5.8 0 - 12 %M   POC Granulocyte 6.8 2 -  6.9   Granulocyte percent 72.7 37 - 80 %G   RBC 4.79 4.69 - 6.13 M/uL   Hemoglobin 14.2 14.1 - 18.1 g/dL   HCT, POC 09.8 (A) 11.9 - 53.7 %   MCV 89.5 80 - 97 fL   MCH, POC 29.7 27 - 31.2 pg   MCHC 33.2 31.8 - 35.4 g/dL   RDW, POC 14.7 %   Platelet Count, POC 201 142 - 424 K/uL   MPV 7.6 0 - 99.8 fL  POCT glucose (manual entry)  Result Value Ref Range   POC Glucose 72 70 - 99 mg/dl   EKG; NSR; no acute process.    Assessment & Plan:   1. Hives   2. Anxiety and depression   3. Insomnia   4. Other chest pain   5. Degenerative disc disease, lumbar     1. Hives: New. Etiology  unclear thus will obtain labs  Rx for Prednisone and Zantac provided. Continue Claritin  daily at home; start Benadryl  qhs.  Rx for Epipen provided since etiology unclear.  To ED for throat swelling, SOB, respiratory distress. Improvement in rash prior to discharge with current in-house treatment. 2.  Anxiety and depression: New.  Secondary to recent death of wife; rx for Celexa  daily provided; rx for Xanax 0.5mg  provided to use while awaiting Celexa to take affect; denies SI/HI.  Follow-up in six weeks. 3.  Insomnia: New. Secondary to anxiety and depression and recent death of wife; rx for Xanax provided for PRN use; also recommend Benadryl qhs acutely for hives. 4.  Chest pain: New.  Secondary to epigastric discomfort with hives; rx for Zantac provided. To ED for acute worsening. 5.  DDD lumbar: chronic; pt to call with ortho preference once new insurance finalized.  Meds ordered this encounter  Medications  . cetirizine (ZYRTEC) tablet 10 mg    Sig:   . diphenhydrAMINE (BENADRYL) capsule 25 mg    Sig:   . ranitidine (ZANTAC) tablet 150 mg    Sig:   . DISCONTD: methylPREDNISolone sodium succinate (SOLU-MEDROL) 125 mg/2 mL injection 125 mg    Sig:   . methylPREDNISolone acetate (DEPO-MEDROL) injection 120 mg    Sig:   . predniSONE (DELTASONE) 20 MG tablet    Sig: Three tablets daily x 2  days then two tablets daily x 5 days then one tablet daily x 5 days    Dispense:  21 tablet    Refill:  0  . loratadine (CLARITIN) 10 MG tablet    Sig: Take 1 tablet (10 mg total) by mouth daily.    Dispense:  30 tablet    Refill:  11  . ranitidine (ZANTAC) 150 MG tablet    Sig: Take 1 tablet (150 mg total) by mouth 2 (two) times daily.    Dispense:  60 tablet    Refill:  0  . EPINEPHrine (EPIPEN 2-PAK) 0.3 mg/0.3 mL IJ SOAJ injection    Sig: Inject 0.3 mLs (0.3 mg total) into the muscle once.    Dispense:  1 Device    Refill:  1  . citalopram (CELEXA) 20 MG tablet    Sig: Take 1 tablet (20 mg total) by mouth daily.    Dispense:  30 tablet    Refill:  3  . ALPRAZolam (XANAX) 0.5 MG tablet    Sig: Take 1 tablet (0.5 mg total) by mouth 2 (two) times daily as needed for anxiety.    Dispense:  45 tablet    Refill:  2   I personally performed the services described in this documentation, which was scribed in my presence. The recorded information has been reviewed and considered.  Kristi Paulita Fujita, M.D. [facility] [address] [place], [place]  [address] [phone] phone [phone] fax

## 2014-11-27 LAB — TSH: TSH: 0.394 u[IU]/mL (ref 0.350–4.500)

## 2014-11-27 LAB — COMPREHENSIVE METABOLIC PANEL
ALT: 18 U/L (ref 0–53)
AST: 17 U/L (ref 0–37)
Albumin: 4.1 g/dL (ref 3.5–5.2)
Alkaline Phosphatase: 54 U/L (ref 39–117)
BILIRUBIN TOTAL: 0.6 mg/dL (ref 0.2–1.2)
BUN: 16 mg/dL (ref 6–23)
CHLORIDE: 103 meq/L (ref 96–112)
CO2: 28 mEq/L (ref 19–32)
CREATININE: 1 mg/dL (ref 0.50–1.35)
Calcium: 8.8 mg/dL (ref 8.4–10.5)
Glucose, Bld: 88 mg/dL (ref 70–99)
Potassium: 4.1 mEq/L (ref 3.5–5.3)
SODIUM: 141 meq/L (ref 135–145)
Total Protein: 6.5 g/dL (ref 6.0–8.3)

## 2015-03-21 ENCOUNTER — Telehealth: Payer: Self-pay

## 2015-03-21 NOTE — Telephone Encounter (Signed)
Celexa will not cause someone to fail a drug test.  Deliah Boston, MS, PA-C   12:48 PM, 03/21/2015

## 2015-03-21 NOTE — Telephone Encounter (Signed)
I do not believe so.

## 2015-03-21 NOTE — Telephone Encounter (Signed)
Pt has failed a drug test and he wants to know if the celexa could cause this   Please call [phone]

## 2015-03-24 NOTE — Telephone Encounter (Signed)
Spoke with pt, advised message from Richard Neal. Pt understood. 

## 2015-06-22 ENCOUNTER — Ambulatory Visit (INDEPENDENT_AMBULATORY_CARE_PROVIDER_SITE_OTHER): Payer: Commercial Managed Care - HMO

## 2015-06-22 ENCOUNTER — Ambulatory Visit (INDEPENDENT_AMBULATORY_CARE_PROVIDER_SITE_OTHER): Payer: Self-pay | Admitting: Emergency Medicine

## 2015-06-22 VITALS — BP 124/78 | HR 65 | Temp 98.0°F | Resp 16 | Ht 70.0 in | Wt 246.0 lb

## 2015-06-22 DIAGNOSIS — R062 Wheezing: Secondary | ICD-10-CM | POA: Diagnosis not present

## 2015-06-22 DIAGNOSIS — R05 Cough: Secondary | ICD-10-CM

## 2015-06-22 DIAGNOSIS — J988 Other specified respiratory disorders: Secondary | ICD-10-CM

## 2015-06-22 DIAGNOSIS — J22 Unspecified acute lower respiratory infection: Secondary | ICD-10-CM

## 2015-06-22 DIAGNOSIS — R059 Cough, unspecified: Secondary | ICD-10-CM

## 2015-06-22 MED ORDER — ALBUTEROL SULFATE 108 (90 BASE) MCG/ACT IN AEPB: 1.0000 | INHALATION_SPRAY | Freq: Four times a day (QID) | RESPIRATORY_TRACT | Status: DC | PRN

## 2015-06-22 MED ORDER — ALBUTEROL SULFATE (2.5 MG/3ML) 0.083% IN NEBU
2.5000 mg | INHALATION_SOLUTION | Freq: Once | RESPIRATORY_TRACT | Status: AC
  Administered 2015-06-22: 2.5 mg via RESPIRATORY_TRACT

## 2015-06-22 MED ORDER — GUAIFENESIN ER 1200 MG PO TB12: 1.0000 | ORAL_TABLET | Freq: Two times a day (BID) | ORAL | Status: DC | PRN

## 2015-06-22 MED ORDER — LORATADINE 10 MG PO TABS: 10.0000 mg | ORAL_TABLET | Freq: Every day | ORAL | Status: DC

## 2015-06-22 MED ORDER — HYDROCOD POLST-CPM POLST ER 10-8 MG/5ML PO SUER: 5.0000 mL | Freq: Every evening | ORAL | Status: DC | PRN

## 2015-06-22 MED ORDER — BENZONATATE 100 MG PO CAPS: 100.0000 mg | ORAL_CAPSULE | Freq: Three times a day (TID) | ORAL | Status: DC | PRN

## 2015-06-22 MED ORDER — IPRATROPIUM BROMIDE 0.02 % IN SOLN
0.5000 mg | Freq: Once | RESPIRATORY_TRACT | Status: AC
  Administered 2015-06-22: 0.5 mg via RESPIRATORY_TRACT

## 2015-06-22 MED ORDER — AZITHROMYCIN 250 MG PO TABS: ORAL_TABLET | ORAL | Status: DC

## 2015-06-22 NOTE — Progress Notes (Signed)
[facility] [address], [place] [id number] [phone]  Date:  06/22/2015   Name:  Richard JacksonJimmy C Neal   DOB:  1955-07-09   MRN:  [id number]  PCP:  No primary care provider on file.    History of Present Illness:  Richard Neal is a 60 y.o. male patient who [facility] cc of headache, cough, and sorethroat for 1.5 weeks.  He has been taking alka seltzer plus, and mucinex not helping.  Non-productive, though feels like something is trying to come up.  Worse at night.  Rattling and wheezing.  Some trouble breathing.   Fever 101.  Patient works outdoors.         There are no active problems to display for this patient.   Past Medical History  Diagnosis Date  . Arthritis     degenerative disc disease lumbar  . Anxiety   . Depression     Past Surgical History  Procedure Laterality Date  . Foot surgery    . Back surgery  2007 and 2010    Social History  Substance Use Topics  . Smoking status: Former Smoker    Types: Cigarettes    Quit date: 05/10/1999  . Smokeless tobacco: Never Used  . Alcohol Use: 0.0 oz/week    0 Standard drinks or equivalent per week     Comment: occasionally    Family History  Problem Relation Age of Onset  . Clotting disorder Mother     No Known Allergies  Medication list has been reviewed and updated.  Current Outpatient Prescriptions on File Prior to Visit  Medication Sig Dispense Refill  . ALPRAZolam (XANAX) 0.5 MG tablet Take 1 tablet (0.5 mg total) by mouth 2 (two) times daily as needed for anxiety. 45 tablet 2  . EPINEPHrine (EPIPEN 2-PAK) 0.3 mg/0.3 mL IJ SOAJ injection Inject 0.3 mLs (0.3 mg total) into the muscle once. 1 Device 1  . ranitidine (ZANTAC) 150 MG tablet Take 1 tablet (150 mg total) by mouth 2 (two) times daily. 60 tablet 0  . citalopram (CELEXA) 20 MG tablet Take 1 tablet (20 mg total) by mouth daily. (Patient not taking: Reported on 06/22/2015) 30 tablet 3  . loratadine (CLARITIN) 10  MG tablet Take 1 tablet (10 mg total) by mouth daily. (Patient not taking: Reported on 06/22/2015) 30 tablet 11  . predniSONE (DELTASONE) 20 MG tablet Three tablets daily x 2 days then two tablets daily x 5 days then one tablet daily x 5 days (Patient not taking: Reported on 06/22/2015) 21 tablet 0   No current facility-administered medications on file prior to visit.    ROS ROS otherwise unremarkable unless listed above.   Physical Examination: BP 124/78 mmHg  Pulse 65  Temp(Src) 98 F (36.7 C) (Oral)  Resp 16  Ht 5\' 10"  (1.778 m)  Wt 246 lb (111.585 kg)  BMI 35.30 kg/m2  SpO2 97% Ideal Body Weight: Weight in (lb) to have BMI = 25: 173.9  Physical Exam  Constitutional: He is oriented to person, place, and time. He appears well-developed and well-nourished. No distress.  HENT:  Head: Normocephalic and atraumatic.  Right Ear: Tympanic membrane, external ear and ear canal normal.  Left Ear: Tympanic membrane, external ear and ear canal normal.  Nose: Right sinus exhibits maxillary sinus tenderness. Right sinus exhibits no frontal sinus tenderness. Left sinus exhibits maxillary sinus tenderness. Left sinus exhibits no frontal sinus tenderness.  Mouth/Throat: No oropharyngeal exudate, posterior  oropharyngeal edema or posterior oropharyngeal erythema.  Eyes: Conjunctivae and EOM are normal. Pupils are equal, round, and reactive to light.  Cardiovascular: Normal rate.   Pulmonary/Chest: Effort normal. No respiratory distress. He has decreased breath sounds. He has wheezes (diffused expiratory).  Lymphadenopathy:    He has no cervical adenopathy.    He has no axillary adenopathy.  Neurological: He is alert and oriented to person, place, and time.  Skin: Skin is warm and dry. He is not diaphoretic.  Psychiatric: He has a normal mood and affect. His behavior is normal.   Peak flow pre to post duo nebulizer: 250-->320 [facility] reading (PRIMARY) by  Dr. Dareen Piano: Negative.  Assessment and  Plan: Richard Neal is a 60 y.o. male who is here today for cc of cough and wheezing.  We will treat at this time. Good improvement with nebulizing treatment.  Advised to use albuterol q4 hours for next 24 hours, then discussed prn use. Azithromycin given.    Lower respiratory infection (e.g., bronchitis, pneumonia, pneumonitis, pulmonitis) - Plan: Guaifenesin (MUCINEX MAXIMUM STRENGTH) 1200 MG TB12, azithromycin (ZITHROMAX) 250 MG tablet, Albuterol Sulfate (PROAIR RESPICLICK) 108 (90 Base) MCG/ACT AEPB, chlorpheniramine-HYDROcodone (TUSSIONEX PENNKINETIC ER) 10-8 MG/5ML SUER, loratadine (CLARITIN) 10 MG tablet  Cough - Plan: DG Chest 2 View, albuterol (PROVENTIL) (2.5 MG/3ML) 0.083% nebulizer solution 2.5 mg, ipratropium (ATROVENT) nebulizer solution 0.5 mg, Guaifenesin (MUCINEX MAXIMUM STRENGTH) 1200 MG TB12, azithromycin (ZITHROMAX) 250 MG tablet, benzonatate (TESSALON) 100 MG capsule, chlorpheniramine-HYDROcodone (TUSSIONEX PENNKINETIC ER) 10-8 MG/5ML SUER, loratadine (CLARITIN) 10 MG tablet  Wheezing - Plan: DG Chest 2 View, albuterol (PROVENTIL) (2.5 MG/3ML) 0.083% nebulizer solution 2.5 mg, ipratropium (ATROVENT) nebulizer solution 0.5 mg, Guaifenesin (MUCINEX MAXIMUM STRENGTH) 1200 MG TB12, azithromycin (ZITHROMAX) 250 MG tablet, Albuterol Sulfate (PROAIR RESPICLICK) 108 (90 Base) MCG/ACT AEPB, loratadine (CLARITIN) 10 MG tablet  Trena Platt, PA-C [facility] 06/22/2015 5:18 PM

## 2015-06-22 NOTE — Patient Instructions (Addendum)
I would like you to take the pro-air every 4 hours for the next 24 hours.   We are going to start the antibiotic.   Please take the azithromycin as prescribed. Please let me know if there is no improvement over the next 48 hours. If you have worsening shortness of breath, I would like you to return.  Make sure you hydrate well with 64 oz of water.

## 2015-06-24 NOTE — Progress Notes (Signed)
  Medical screening examination/treatment/procedure(s) were performed by non-physician practitioner and as supervising physician I was immediately available for consultation/collaboration.     

## 2015-07-02 ENCOUNTER — Ambulatory Visit (INDEPENDENT_AMBULATORY_CARE_PROVIDER_SITE_OTHER): Payer: Self-pay | Admitting: Physician Assistant

## 2015-07-02 VITALS — BP 120/82 | HR 73 | Temp 98.2°F | Resp 18 | Ht 71.0 in | Wt 245.0 lb

## 2015-07-02 DIAGNOSIS — J22 Unspecified acute lower respiratory infection: Secondary | ICD-10-CM

## 2015-07-02 DIAGNOSIS — R059 Cough, unspecified: Secondary | ICD-10-CM

## 2015-07-02 DIAGNOSIS — J988 Other specified respiratory disorders: Secondary | ICD-10-CM

## 2015-07-02 DIAGNOSIS — J019 Acute sinusitis, unspecified: Secondary | ICD-10-CM

## 2015-07-02 DIAGNOSIS — R05 Cough: Secondary | ICD-10-CM

## 2015-07-02 MED ORDER — AMOXICILLIN-POT CLAVULANATE 875-125 MG PO TABS: 1.0000 | ORAL_TABLET | Freq: Two times a day (BID) | ORAL | Status: AC

## 2015-07-02 MED ORDER — HYDROCOD POLST-CPM POLST ER 10-8 MG/5ML PO SUER
5.0000 mL | Freq: Every evening | ORAL | Status: DC | PRN
Start: 2015-07-02 — End: 2021-07-05

## 2015-07-02 MED ORDER — IPRATROPIUM BROMIDE 0.03 % NA SOLN: 2.0000 | Freq: Two times a day (BID) | NASAL | Status: DC

## 2015-07-02 MED ORDER — BENZONATATE 100 MG PO CAPS: 100.0000 mg | ORAL_CAPSULE | Freq: Three times a day (TID) | ORAL | Status: DC | PRN

## 2015-07-02 NOTE — Progress Notes (Signed)
[facility] [address] [place] [address] [phone]  Date:  07/02/2015   Name:  Richard Neal   DOB:  Dec 08, 1955   MRN:  [id number]  PCP:  No primary care provider on file.   Chief Complaint  Patient presents with  . URI    History of Present Illness:  Richard Neal is a 60 y.o. male patient who presents to [facility] for follow up for nasal congestion, rhinorrhea, and cough.      He was good.  He sprayed the home with lysol.  Mucus congestion, aches, can not sleep.  Nose is running.  He is taking alka-seltzer cold.  He has sinus facial pain, and right above his eyes.  He feels as if he has to keep clearing throat.  No fever.  Yellowish green and thick.      There are no active problems to display for this patient.   Past Medical History  Diagnosis Date  . Arthritis     degenerative disc disease lumbar  . Anxiety   . Depression     Past Surgical History  Procedure Laterality Date  . Foot surgery    . Back surgery  2007 and 2010    Social History  Substance Use Topics  . Smoking status: Former Smoker    Types: Cigarettes    Quit date: 05/10/1999  . Smokeless tobacco: Never Used  . Alcohol Use: 0.0 oz/week    0 Standard drinks or equivalent per week     Comment: occasionally    Family History  Problem Relation Age of Onset  . Clotting disorder Mother     No Known Allergies  Medication list has been reviewed and updated.  Current Outpatient Prescriptions on File Prior to Visit  Medication Sig Dispense Refill  . Albuterol Sulfate (PROAIR RESPICLICK) 108 (90 Base) MCG/ACT AEPB Inhale 1 puff into the lungs every 6 (six) hours as needed. 1 each 1  . ALPRAZolam (XANAX) 0.5 MG tablet Take 1 tablet (0.5 mg total) by mouth 2 (two) times daily as needed for anxiety. 45 tablet 2  . azithromycin (ZITHROMAX) 250 MG tablet Take 2 tabs PO x 1 dose, then 1 tab PO QD x 4 days 6 tablet 0  . benzonatate (TESSALON) 100 MG capsule Take 1-2  capsules (100-200 mg total) by mouth 3 (three) times daily as needed for cough. 40 capsule 0  . chlorpheniramine-HYDROcodone (TUSSIONEX PENNKINETIC ER) 10-8 MG/5ML SUER Take 5 mLs by mouth at bedtime as needed. 80 mL 0  . citalopram (CELEXA) 20 MG tablet Take 1 tablet (20 mg total) by mouth daily. 30 tablet 3  . EPINEPHrine (EPIPEN 2-PAK) 0.3 mg/0.3 mL IJ SOAJ injection Inject 0.3 mLs (0.3 mg total) into the muscle once. 1 Device 1  . Guaifenesin (MUCINEX MAXIMUM STRENGTH) 1200 MG TB12 Take 1 tablet (1,200 mg total) by mouth every 12 (twelve) hours as needed. 14 tablet 1  . loratadine (CLARITIN) 10 MG tablet Take 1 tablet (10 mg total) by mouth daily. 30 tablet 2  . Phenyleph-CPM-DM-APAP (ALKA-SELTZER PLUS COLD & COUGH) 10-09-08-325 MG CAPS Take by mouth.    . predniSONE (DELTASONE) 20 MG tablet Three tablets daily x 2 days then two tablets daily x 5 days then one tablet daily x 5 days 21 tablet 0  . ranitidine (ZANTAC) 150 MG tablet Take 1 tablet (150 mg total) by mouth 2 (two) times daily. 60 tablet 0   No current facility-administered medications on  file prior to visit.    ROS ROS otherwise unremarkable unless  Listed above.   Physical Examination: BP 120/82 mmHg  Pulse 73  Temp(Src) 98.2 F (36.8 C) (Oral)  Resp 18  Ht  (1.803 m)  Wt 245 lb (111.131 kg)  BMI 34.19 kg/m2  SpO2 97% Ideal Body Weight: Weight in (lb) to have BMI = 25: 178.9  Physical Exam  Constitutional: He is oriented to person, place, and time. He appears well-developed and well-nourished. No distress.  HENT:  Head: Normocephalic and atraumatic.  Right Ear: Tympanic membrane, external ear and ear canal normal.  Left Ear: Tympanic membrane, external ear and ear canal normal.  Nose: Mucosal edema and rhinorrhea present. Right sinus exhibits no maxillary sinus tenderness and no frontal sinus tenderness. Left sinus exhibits no maxillary sinus tenderness and no frontal sinus tenderness.  Mouth/Throat: No uvula  swelling. No oropharyngeal exudate, posterior oropharyngeal edema or posterior oropharyngeal erythema.  Eyes: Conjunctivae, EOM and lids are normal. Pupils are equal, round, and reactive to light. Right eye exhibits normal extraocular motion. Left eye exhibits normal extraocular motion.  Neck: Trachea normal and full passive range of motion without pain. No edema and no erythema present.  Cardiovascular: Normal rate and regular rhythm.  Exam reveals no friction rub.   No murmur heard. Pulmonary/Chest: Effort normal. No respiratory distress. He has no decreased breath sounds. He has no wheezes. He has rhonchi.  Neurological: He is alert and oriented to person, place, and time.  Skin: Skin is warm and dry. He is not diaphoretic.  Psychiatric: He has a normal mood and affect. His behavior is normal.     Assessment and Plan: Richard Neal is a 60 y.o. male who is here today for sinus congestion, and cough.   Subacute sinusitis, unspecified location - Plan: amoxicillin-clavulanate (AUGMENTIN) 875-125 MG tablet, ipratropium (ATROVENT) 0.03 % nasal spray  Cough - Plan: chlorpheniramine-HYDROcodone (TUSSIONEX PENNKINETIC ER) 10-8 MG/5ML SUER, benzonatate (TESSALON) 100 MG capsule  Lower respiratory infection (e.g., bronchitis, pneumonia, pneumonitis, pulmonitis) - Plan: chlorpheniramine-HYDROcodone (TUSSIONEX PENNKINETIC ER) 10-8 MG/5ML SUER, ipratropium (ATROVENT) 0.03 % nasal spray  Trena Platt, PA-C [facility] 1/31/201710:01 PM

## 2015-07-02 NOTE — Patient Instructions (Signed)

## 2016-07-04 ENCOUNTER — Emergency Department (HOSPITAL_BASED_OUTPATIENT_CLINIC_OR_DEPARTMENT_OTHER)
Admission: EM | Admit: 2016-07-04 | Discharge: 2016-07-04 | Disposition: A | Discharge status: Home / Self Care | Payer: Self-pay | Attending: Emergency Medicine | Admitting: Emergency Medicine

## 2016-07-04 ENCOUNTER — Encounter (HOSPITAL_BASED_OUTPATIENT_CLINIC_OR_DEPARTMENT_OTHER): Payer: Self-pay | Admitting: Emergency Medicine

## 2016-07-04 DIAGNOSIS — Z79899 Other long term (current) drug therapy: Secondary | ICD-10-CM | POA: Insufficient documentation

## 2016-07-04 DIAGNOSIS — H66002 Acute suppurative otitis media without spontaneous rupture of ear drum, left ear: Secondary | ICD-10-CM | POA: Insufficient documentation

## 2016-07-04 DIAGNOSIS — Z87891 Personal history of nicotine dependence: Secondary | ICD-10-CM | POA: Insufficient documentation

## 2016-07-04 MED ORDER — AMOXICILLIN 500 MG PO CAPS: 500.0000 mg | ORAL_CAPSULE | Freq: Three times a day (TID) | ORAL | 0 refills | Status: DC

## 2016-07-04 MED FILL — AMOXICILLIN 500 MG CAPSULE: 500 | 10 days supply | Qty: 30 | Fill #0

## 2016-07-04 NOTE — ED Provider Notes (Signed)
[facility] Provider Note   CSN: [id number] Arrival date & time: 07/04/16  1501   By signing my name below, I, Clarisse Gouge, attest that this documentation has been prepared under the direction and in the presence of Langston Masker, [place]. Electronically Signed: Clarisse Gouge, Scribe. 07/04/16. 5:24 PM.   History   Chief Complaint Chief Complaint  Patient presents with  . Otalgia   The history is provided by the patient and medical records. No language interpreter was used.    HPI Comments: Richard Neal is an otherwise healthy 61 y.o. male who presents to the Emergency Department complaining of progressively worsening, sharp left ear pain and congestion x ~1 week. He states that he is getting over flu like symptoms of 2-3 weeks with sinus pain and drainage. Triage notes pt had N/V with aforementioned flu-like symptoms. He states he relieved his prior symptoms by taking decongestant at home, but the congestion transferred to his ear. He denies cough. Pt a non smoker. [facility] [address] [facility].  Past Medical History:  Diagnosis Date  . Anxiety   . Arthritis    degenerative disc disease lumbar  . Depression     There are no active problems to display for this patient.   Past Surgical History:  Procedure Laterality Date  . BACK SURGERY  2007 and 2010  . FOOT SURGERY         Home Medications    Prior to Admission medications   Medication Sig Start Date End Date Taking? Authorizing Provider  Albuterol Sulfate (PROAIR RESPICLICK) 108 (90 Base) MCG/ACT AEPB Inhale 1 puff into the lungs every 6 (six) hours as needed. 06/22/15   Collie Siad English, PA  ALPRAZolam (XANAX) 0.5 MG tablet Take 1 tablet (0.5 mg total) by mouth 2 (two) times daily as needed for anxiety. 11/26/14   Ethelda Chick, MD  amoxicillin (AMOXIL) 500 MG capsule Take 1 capsule (500 mg total) by mouth 3 (three) times daily. 07/04/16   Elson Areas, PA-C  azithromycin  (ZITHROMAX) 250 MG tablet Take 2 tabs PO x 1 dose, then 1 tab PO QD x 4 days 06/22/15   Collie Siad English, PA  benzonatate (TESSALON) 100 MG capsule Take 1-2 capsules (100-200 mg total) by mouth 3 (three) times daily as needed for cough. 07/02/15   Collie Siad English, PA  chlorpheniramine-HYDROcodone (TUSSIONEX PENNKINETIC ER) 10-8 MG/5ML SUER Take 5 mLs by mouth at bedtime as needed. 07/02/15   Collie Siad English, PA  citalopram (CELEXA) 20 MG tablet Take 1 tablet (20 mg total) by mouth daily. 11/26/14   Ethelda Chick, MD  EPINEPHrine (EPIPEN 2-PAK) 0.3 mg/0.3 mL IJ SOAJ injection Inject 0.3 mLs (0.3 mg total) into the muscle once. 11/26/14   Ethelda Chick, MD  Guaifenesin [facility] MAXIMUM STRENGTH) 1200 MG TB12 Take 1 tablet (1,200 mg total) by mouth every 12 (twelve) hours as needed. 06/22/15   Collie Siad English, PA  ipratropium (ATROVENT) 0.03 % nasal spray Place 2 sprays into both nostrils 2 (two) times daily. 07/02/15   Collie Siad English, PA  loratadine (CLARITIN) 10 MG tablet Take 1 tablet (10 mg total) by mouth daily. 06/22/15   Collie Siad English, PA  Phenyleph-CPM-DM-APAP (ALKA-SELTZER PLUS COLD & COUGH) 10-09-08-325 MG CAPS Take by mouth.    Historical Provider, MD  predniSONE (DELTASONE) 20 MG tablet Three tablets daily x 2 days then two tablets daily x 5 days then one tablet  daily x 5 days 11/26/14   Ethelda Chick, MD  ranitidine (ZANTAC) 150 MG tablet Take 1 tablet (150 mg total) by mouth 2 (two) times daily. 11/26/14   Ethelda Chick, MD    Family History Family History  Problem Relation Age of Onset  . Clotting disorder Mother     Social History Social History  Substance Use Topics  . Smoking status: Former Smoker    Types: Cigarettes    Quit date: 05/10/1999  . Smokeless tobacco: Never Used  . Alcohol use 0.0 oz/week     Comment: occasionally     Allergies   Patient has no known allergies.   Review of Systems Review of Systems A complete 10 system review of  systems was obtained and all systems are negative except as noted in the HPI and PMH.    Physical Exam Updated Vital Signs BP 147/58 (BP Location: Right Arm)   Pulse 62   Temp 97.8 F (36.6 C) (Oral)   Resp 18   Ht 5\' 11"  (1.803 m)   Wt 242 lb (109.8 kg)   SpO2 97%   BMI 33.75 kg/m   Physical Exam  Constitutional: He is oriented to person, place, and time. Vital signs are normal. He appears well-developed and well-nourished.  Non-toxic appearance. No distress.  Afebrile, nontoxic, NAD  HENT:  Head: Normocephalic and atraumatic.  Left Ear: Tympanic membrane is erythematous and bulging.  Mouth/Throat: Mucous membranes are normal.  Eyes: Conjunctivae and EOM are normal. Right eye exhibits no discharge. Left eye exhibits no discharge.  Neck: Normal range of motion. Neck supple.  Cardiovascular: Normal rate and intact distal pulses.   Pulmonary/Chest: Effort normal. No respiratory distress.  Abdominal: Normal appearance. He exhibits no distension.  Musculoskeletal: Normal range of motion.  Neurological: He is alert and oriented to person, place, and time. He has normal strength. No sensory deficit.  Skin: Skin is warm, dry and intact. No rash noted.  Psychiatric: He has a normal mood and affect.  Nursing note and vitals reviewed.    ED Treatments / Results  DIAGNOSTIC STUDIES: Oxygen Saturation is 97% on RA, adequate by my interpretation.    COORDINATION OF CARE: 5:01 PM Discussed treatment plan with pt at bedside and pt agreed to plan. Will order abx. Pt advised to continue use of decongestants at home.   Labs (all labs ordered are listed, but only abnormal results are displayed) Labs Reviewed - No data to display  EKG  EKG Interpretation None       Radiology No results found.  Procedures Procedures (including critical care time)  Medications Ordered in ED Medications - No data to display   Initial Impression / Assessment and Plan / ED Course  I have  reviewed the triage vital signs and the nursing notes.  Pertinent labs & imaging results that were available during my care of the patient were reviewed by me and considered in my medical decision making (see chart for details).       Final Clinical Impressions(s) / ED Diagnoses   Final diagnoses:  Acute suppurative otitis media of left ear without spontaneous rupture of tympanic membrane, recurrence not specified   Meds ordered this encounter  Medications  . amoxicillin (AMOXIL) 500 MG capsule    Sig: Take 1 capsule (500 mg total) by mouth 3 (three) times daily.    Dispense:  30 capsule    Refill:  0    Order Specific Question:   Supervising Provider  Answer:   Eber HongMILLER, BRIAN [[id number]]  An After Visit Summary was printed and given to the patient.  I personally performed the services in this documentation, which was scribed in my presence.  The recorded information has been reviewed and considered.   Barnet PallKaren SofiaPAC.  New Prescriptions New Prescriptions   AMOXICILLIN (AMOXIL) 500 MG CAPSULE    Take 1 capsule (500 mg total) by mouth 3 (three) times daily.     Lonia SkinnerLeslie K Weston MillsSofia, PA-C 07/05/16 0130    Vanetta MuldersScott Zackowski, MD 07/08/16 2322

## 2016-07-04 NOTE — ED Triage Notes (Signed)
Patient states that he was sick 3 weeks ago with N/V  - the patient reports that then about 1 week ago he started to use a nasal spray and woke up the next morning with pain to his left ear and face area

## 2016-07-04 NOTE — Discharge Instructions (Signed)
Return if any problems.

## 2021-04-04 ENCOUNTER — Ambulatory Visit (INDEPENDENT_AMBULATORY_CARE_PROVIDER_SITE_OTHER): Payer: 59 | Admitting: Family Medicine

## 2021-04-04 ENCOUNTER — Other Ambulatory Visit: Payer: Self-pay

## 2021-04-04 ENCOUNTER — Encounter: Payer: Self-pay | Admitting: Family Medicine

## 2021-04-04 VITALS — BP 128/81 | HR 73 | Temp 98.3°F | Resp 16 | Ht 71.0 in | Wt 239.8 lb

## 2021-04-04 DIAGNOSIS — L989 Disorder of the skin and subcutaneous tissue, unspecified: Secondary | ICD-10-CM | POA: Diagnosis not present

## 2021-04-04 DIAGNOSIS — Z7689 Persons encountering health services in other specified circumstances: Secondary | ICD-10-CM

## 2021-04-04 NOTE — Progress Notes (Signed)
New Patient Office Visit  Subjective:  Patient ID: Richard Neal, male    DOB: 07/04/1955  Age: 65 y.o. MRN: [id number]  CC:  Chief Complaint  Patient presents with   Annual Exam   Establish Care    HPI Richard Neal presents for to establish care. Patient has complaint today of multiple skin lesions. 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 Lesions ar itchy and some are changing.   Past Medical History:  Diagnosis Date   Anxiety    Arthritis    degenerative disc disease lumbar   Depression     Past Surgical History:  Procedure Laterality Date   BACK SURGERY  2007 and 2010   FOOT SURGERY      Family History  Problem Relation Age of Onset   Clotting disorder Mother     Social History   Socioeconomic History   Marital status: Married    Spouse name: Not on file   Number of children: Not on file   Years of education: Not on file   Highest education level: Not on file  Occupational History   Not on file  Tobacco Use   Smoking status: Former    Types: Cigarettes    Quit date: 05/10/1999    Years since quitting: 21.9   Smokeless tobacco: Never  Substance and Sexual Activity   Alcohol use: Yes    Alcohol/week: 0.0 standard drinks    Comment: occasionally   Drug use: No   Sexual activity: Yes  Other Topics Concern   Not on file  Social History Narrative   Not on file   Social Determinants of Health   Financial Resource Strain: Not on file  Food Insecurity: Not on file  Transportation Needs: Not on file  Physical Activity: Not on file  Stress: Not on file  Social Connections: Not on file  Intimate Partner Violence: Not on file    ROS Review of Systems  All other systems reviewed and are negative.  Objective:   Today's Vitals: BP 128/81 (BP Location: Right Arm, Patient Position: Sitting, Cuff Size: Large)   Pulse 73   Temp 98.3 F (36.8 C) (Oral)   Resp 16   Ht 5\' 11"  (1.803 m)   Wt 239 lb 12.8 oz (108.8 kg)   SpO2  95%   BMI 33.45 kg/m   Physical Exam Vitals and nursing note reviewed.  Constitutional:      General: He is not in acute distress. Cardiovascular:     Rate and Rhythm: Normal rate and regular rhythm.  Pulmonary:     Effort: Pulmonary effort is normal.     Breath sounds: Normal breath sounds.  Skin:    Findings: Lesion (multiple, scaly, diffuse) present.  Neurological:     General: No focal deficit present.     Mental Status: He is alert and oriented to person, place, and time.    Assessment & Plan:   1. Skin lesion Discussed skin care. Referral to derm for further eval/mgt - Ambulatory referral to Dermatology  2. Encounter to establish care     Outpatient Encounter Medications as of 04/04/2021  Medication Sig   Albuterol Sulfate (PROAIR RESPICLICK) 108 (90 Base) MCG/ACT AEPB Inhale 1 puff into the lungs every 6 (six) hours as needed.   ALPRAZolam (XANAX) 0.5 MG tablet Take 1 tablet (0.5 mg total) by mouth 2 (two) times daily as needed for anxiety.   amoxicillin (AMOXIL) 500 MG capsule Take 1  capsule (500 mg total) by mouth 3 (three) times daily.   azithromycin (ZITHROMAX) 250 MG tablet Take 2 tabs PO x 1 dose, then 1 tab PO QD x 4 days   benzonatate (TESSALON) 100 MG capsule Take 1-2 capsules (100-200 mg total) by mouth 3 (three) times daily as needed for cough.   chlorpheniramine-HYDROcodone (TUSSIONEX PENNKINETIC ER) 10-8 MG/5ML SUER Take 5 mLs by mouth at bedtime as needed.   citalopram (CELEXA) 20 MG tablet Take 1 tablet (20 mg total) by mouth daily.   cyanocobalamin 50 MCG tablet Take by mouth.   EPINEPHrine (EPIPEN 2-PAK) 0.3 mg/0.3 mL IJ SOAJ injection Inject 0.3 mLs (0.3 mg total) into the muscle once.   Guaifenesin (MUCINEX MAXIMUM STRENGTH) 1200 MG TB12 Take 1 tablet (1,200 mg total) by mouth every 12 (twelve) hours as needed.   ibuprofen (ADVIL) 200 MG tablet Take by mouth.   ipratropium (ATROVENT) 0.03 % nasal spray Place 2 sprays into both nostrils 2 (two)  times daily.   loratadine (CLARITIN) 10 MG tablet Take 1 tablet (10 mg total) by mouth daily.   Phenyleph-CPM-DM-APAP 10-09-08-325 MG CAPS Take by mouth.   predniSONE (DELTASONE) 20 MG tablet Three tablets daily x 2 days then two tablets daily x 5 days then one tablet daily x 5 days   ranitidine (ZANTAC) 150 MG tablet Take 1 tablet (150 mg total) by mouth 2 (two) times daily.   No facility-administered encounter medications on file as of 04/04/2021.    Follow-up: Return in about 3 months (around 07/05/2021) for physical.   Tommie Raymond, MD

## 2021-04-04 NOTE — Progress Notes (Signed)
Patient is here to est care with Dr. Andrey Campanile  Patient is concerned about dry spots on body

## 2021-07-05 ENCOUNTER — Encounter (INDEPENDENT_AMBULATORY_CARE_PROVIDER_SITE_OTHER): Payer: Self-pay

## 2021-07-05 ENCOUNTER — Ambulatory Visit: Payer: 59 | Admitting: Family Medicine

## 2021-07-05 ENCOUNTER — Encounter: Payer: Self-pay | Admitting: Family Medicine

## 2021-07-05 ENCOUNTER — Other Ambulatory Visit: Payer: Self-pay

## 2021-07-05 VITALS — BP 132/79 | HR 63 | Temp 98.4°F | Resp 16 | Wt 245.6 lb

## 2021-07-05 DIAGNOSIS — Z130 Encounter for screening for diseases of the blood and blood-forming organs and certain disorders involving the immune mechanism: Secondary | ICD-10-CM

## 2021-07-05 DIAGNOSIS — Z0000 Encounter for general adult medical examination without abnormal findings: Secondary | ICD-10-CM

## 2021-07-05 DIAGNOSIS — Z1159 Encounter for screening for other viral diseases: Secondary | ICD-10-CM

## 2021-07-05 DIAGNOSIS — Z13220 Encounter for screening for lipoid disorders: Secondary | ICD-10-CM

## 2021-07-05 DIAGNOSIS — Z1211 Encounter for screening for malignant neoplasm of colon: Secondary | ICD-10-CM

## 2021-07-05 DIAGNOSIS — Z23 Encounter for immunization: Secondary | ICD-10-CM

## 2021-07-05 DIAGNOSIS — Z114 Encounter for screening for human immunodeficiency virus [HIV]: Secondary | ICD-10-CM

## 2021-07-05 DIAGNOSIS — Z13228 Encounter for screening for other metabolic disorders: Secondary | ICD-10-CM

## 2021-07-05 NOTE — Progress Notes (Signed)
Patient is here for his CPE. Patient has no new concerns today

## 2021-07-05 NOTE — Progress Notes (Signed)
New Patient Office Visit  Subjective:  Patient ID: Richard Neal, male    DOB: [birth year]  Age: 66 y.o. MRN: [id number]  CC:  Chief Complaint  Patient presents with   Annual Exam    HPI [name] presents for routine annual exam. Patient denies acute complaint or concern.  Past Medical History:  Diagnosis Date   Anxiety    Arthritis    degenerative disc disease lumbar   Depression     Past Surgical History:  Procedure Laterality Date   BACK SURGERY  2007 and 2010   FOOT SURGERY      Family History  Problem Relation Age of Onset   Clotting disorder Mother     Social History   Socioeconomic History   Marital status: Married    Spouse name: Not on file   Number of children: Not on file   Years of education: Not on file   Highest education level: Not on file  Occupational History   Not on file  Tobacco Use   Smoking status: Former    Types: Cigarettes    Quit date: 05/10/1999    Years since quitting: 22.1   Smokeless tobacco: Never  Substance and Sexual Activity   Alcohol use: Yes    Alcohol/week: 0.0 standard drinks    Comment: occasionally   Drug use: No   Sexual activity: Yes  Other Topics Concern   Not on file  Social History Narrative   Not on file   Social Determinants of Health   Financial Resource Strain: Not on file  Food Insecurity: Not on file  Transportation Needs: Not on file  Physical Activity: Not on file  Stress: Not on file  Social Connections: Not on file  Intimate Partner Violence: Not on file    ROS Review of Systems  All other systems reviewed and are negative.  Objective:   Today's Vitals: BP 132/79    Pulse 63    Temp 98.4 F (36.9 C) (Oral)    Resp 16    Wt 245 lb 9.6 oz (111.4 kg)    SpO2 95%    BMI 34.25 kg/m   Physical Exam Vitals and nursing note reviewed.  Constitutional:      General: He is not in acute distress.    Appearance: He is obese.  HENT:     Head: Normocephalic and atraumatic.      Right Ear: Tympanic membrane, ear canal and external ear normal.     Left Ear: Tympanic membrane, ear canal and external ear normal.     Nose: Nose normal.     Mouth/Throat:     Mouth: Mucous membranes are moist.     Pharynx: Oropharynx is clear.  Eyes:     Conjunctiva/sclera: Conjunctivae normal.     Pupils: Pupils are equal, round, and reactive to light.  Neck:     Thyroid: No thyromegaly.  Cardiovascular:     Rate and Rhythm: Normal rate and regular rhythm.     Heart sounds: Normal heart sounds. No murmur heard. Pulmonary:     Effort: Pulmonary effort is normal.     Breath sounds: Normal breath sounds.  Abdominal:     General: There is no distension.     Palpations: Abdomen is soft. There is no mass.     Tenderness: There is no abdominal tenderness.     Hernia: There is no hernia in the left inguinal area or right inguinal area.  Genitourinary:  Comments: Patient deferred Musculoskeletal:        General: Normal range of motion.     Cervical back: Normal range of motion and neck supple.     Right lower leg: No edema.     Left lower leg: No edema.  Skin:    General: Skin is warm and dry.  Neurological:     General: No focal deficit present.     Mental Status: He is alert and oriented to person, place, and time. Mental status is at baseline.  Psychiatric:        Mood and Affect: Mood normal.        Behavior: Behavior normal.    Assessment & Plan:   1. Annual physical exam Routine labs ordered.  - CMP14+EGFR  2. Screening for colon cancer  - Ambulatory referral to Gastroenterology  3. Screening for HIV (human immunodeficiency virus)   4. Need for hepatitis C screening test  - Hepatitis C Antibody  5. Screening for endocrine/metabolic/immunity disorders  - TSH  6. Screening for deficiency anemia   7. Screening for lipid disorders  - Lipid Panel  8. Need for prophylactic vaccination and inoculation against influenza  - Ambulatory referral to  Gastroenterology  9. Need for immunization against influenza  - Flu Vaccine QUAD High Dose(Fluad)    Outpatient Encounter Medications as of 07/05/2021  Medication Sig   EPINEPHrine (EPIPEN 2-PAK) 0.3 mg/0.3 mL IJ SOAJ injection Inject 0.3 mLs (0.3 mg total) into the muscle once.   ibuprofen (ADVIL) 200 MG tablet Take by mouth.   [DISCONTINUED] Albuterol Sulfate (PROAIR RESPICLICK) 127 (90 Base) MCG/ACT AEPB Inhale 1 puff into the lungs every 6 (six) hours as needed.   [DISCONTINUED] ALPRAZolam (XANAX) 0.5 MG tablet Take 1 tablet (0.5 mg total) by mouth 2 (two) times daily as needed for anxiety.   [DISCONTINUED] amoxicillin (AMOXIL) 500 MG capsule Take 1 capsule (500 mg total) by mouth 3 (three) times daily.   [DISCONTINUED] azithromycin (ZITHROMAX) 250 MG tablet Take 2 tabs PO x 1 dose, then 1 tab PO QD x 4 days   [DISCONTINUED] benzonatate (TESSALON) 100 MG capsule Take 1-2 capsules (100-200 mg total) by mouth 3 (three) times daily as needed for cough.   [DISCONTINUED] chlorpheniramine-HYDROcodone (TUSSIONEX PENNKINETIC ER) 10-8 MG/5ML SUER Take 5 mLs by mouth at bedtime as needed.   [DISCONTINUED] citalopram (CELEXA) 20 MG tablet Take 1 tablet (20 mg total) by mouth daily.   [DISCONTINUED] cyanocobalamin 50 MCG tablet Take by mouth.   [DISCONTINUED] Guaifenesin (MUCINEX MAXIMUM STRENGTH) 1200 MG TB12 Take 1 tablet (1,200 mg total) by mouth every 12 (twelve) hours as needed.   [DISCONTINUED] ipratropium (ATROVENT) 0.03 % nasal spray Place 2 sprays into both nostrils 2 (two) times daily.   [DISCONTINUED] loratadine (CLARITIN) 10 MG tablet Take 1 tablet (10 mg total) by mouth daily.   [DISCONTINUED] Phenyleph-CPM-DM-APAP 10-09-08-325 MG CAPS Take by mouth.   [DISCONTINUED] predniSONE (DELTASONE) 20 MG tablet Three tablets daily x 2 days then two tablets daily x 5 days then one tablet daily x 5 days   [DISCONTINUED] ranitidine (ZANTAC) 150 MG tablet Take 1 tablet (150 mg total) by mouth 2 (two)  times daily.   No facility-administered encounter medications on file as of 07/05/2021.    Follow-up: No follow-ups on file.   Becky Sax, MD

## 2021-07-06 LAB — LIPID PANEL
Chol/HDL Ratio: 4.5 ratio (ref 0.0–5.0)
Cholesterol, Total: 201 mg/dL — ABNORMAL HIGH (ref 100–199)
HDL: 45 mg/dL (ref >39)
LDL Chol Calc (NIH): 121 mg/dL — ABNORMAL HIGH (ref 0–99)
Triglycerides: 196 mg/dL — ABNORMAL HIGH (ref 0–149)
VLDL Cholesterol Cal: 35 mg/dL (ref 5–40)

## 2021-07-06 LAB — CMP14+EGFR
ALT: 24 IU/L (ref 0–44)
AST: 18 IU/L (ref 0–40)
Albumin/Globulin Ratio: 2.2 (ref 1.2–2.2)
Albumin: 4.4 g/dL (ref 3.8–4.8)
Alkaline Phosphatase: 58 IU/L (ref 44–121)
BUN/Creatinine Ratio: 12 (ref 10–24)
BUN: 13 mg/dL (ref 8–27)
Bilirubin Total: 0.4 mg/dL (ref 0.0–1.2)
CO2: 24 mmol/L (ref 20–29)
Calcium: 9.4 mg/dL (ref 8.6–10.2)
Chloride: 100 mmol/L (ref 96–106)
Creatinine, Ser: 1.08 mg/dL (ref 0.76–1.27)
Globulin, Total: 2 g/dL (ref 1.5–4.5)
Glucose: 114 mg/dL — ABNORMAL HIGH (ref 70–99)
Potassium: 4.5 mmol/L (ref 3.5–5.2)
Sodium: 142 mmol/L (ref 134–144)
Total Protein: 6.4 g/dL (ref 6.0–8.5)
eGFR: 76 mL/min/{1.73_m2} (ref >59)

## 2021-07-06 LAB — TSH: TSH: 0.814 u[IU]/mL (ref 0.450–4.500)

## 2021-07-06 LAB — HEPATITIS C ANTIBODY: Hep C Virus Ab: <0.1 s/co ratio (ref 0.0–0.9)

## 2021-08-07 ENCOUNTER — Encounter: Payer: Self-pay | Admitting: Family Medicine

## 2021-08-09 ENCOUNTER — Encounter: Payer: Self-pay | Admitting: Emergency Medicine

## 2021-08-09 ENCOUNTER — Ambulatory Visit
Admission: EM | Admit: 2021-08-09 | Discharge: 2021-08-09 | Disposition: A | Discharge status: Home / Self Care | Payer: Self-pay | Attending: Internal Medicine | Admitting: Internal Medicine

## 2021-08-09 DIAGNOSIS — J069 Acute upper respiratory infection, unspecified: Secondary | ICD-10-CM

## 2021-08-09 MED ORDER — BENZONATATE 100 MG PO CAPS: 100.0000 mg | ORAL_CAPSULE | Freq: Three times a day (TID) | ORAL | 0 refills | Status: AC | PRN

## 2021-08-09 MED ORDER — FLUTICASONE PROPIONATE 50 MCG/ACT NA SUSP
1.0000 | Freq: Every day | NASAL | 0 refills | Status: AC
Start: 2021-08-09 — End: 2021-08-12

## 2021-08-09 NOTE — ED Triage Notes (Signed)
3 days prior began with nasal congestion, cough, and fatigue ?

## 2021-08-09 NOTE — ED Provider Notes (Signed)
?[facility] ? ? ? ?CSN: [id number] ?Arrival date & time: 08/09/21  1846 ? ? ?  ? ?History   ?Chief Complaint ?Chief Complaint  ?Patient presents with  ? Cough  ? ? ?HPI ?Richard Neal is a 66 y.o. male.  ? ?Patient presents with 3-day history of nasal congestion, cough, fatigue.  Denies any known fevers or sick contacts.  Patient has taken over-the-counter cold and flu medication with some improvement in symptoms.  Denies chest pain, shortness of breath, sore throat, ear pain, nausea, vomiting, diarrhea, abdominal pain.  Denies history of asthma or COPD. ? ? ?Cough ? ?Past Medical History:  ?Diagnosis Date  ? Anxiety   ? Arthritis   ? degenerative disc disease lumbar  ? Depression   ? ? ?There are no problems to display for this patient. ? ? ?Past Surgical History:  ?Procedure Laterality Date  ? BACK SURGERY  2007 and 2010  ? FOOT SURGERY    ? ? ? ? ? ?Home Medications   ? ?Prior to Admission medications   ?Medication Sig Start Date End Date Taking? Authorizing Provider  ?benzonatate (TESSALON) 100 MG capsule Take 1 capsule (100 mg total) by mouth every 8 (eight) hours as needed for cough. 08/09/21  Yes Gustavus Bryant, FNP  ?fluticasone (FLONASE) 50 MCG/ACT nasal spray Place 1 spray into both nostrils daily for 3 days. 08/09/21 08/12/21 Yes Gustavus Bryant, FNP  ?EPINEPHrine (EPIPEN 2-PAK) 0.3 mg/0.3 mL IJ SOAJ injection Inject 0.3 mLs (0.3 mg total) into the muscle once. 11/26/14   Ethelda Chick, MD  ?ibuprofen (ADVIL) 200 MG tablet Take by mouth.    [provider]  ? ? ?Family History ?Family History  ?Problem Relation Age of Onset  ? Clotting disorder Mother   ? ? ?Social History ?Social History  ? ?Tobacco Use  ? Smoking status: Former  ?  Types: Cigarettes  ?  Quit date: 05/10/1999  ?  Years since quitting: 22.2  ? Smokeless tobacco: Never  ?Substance Use Topics  ? Alcohol use: Yes  ?  Alcohol/week: 0.0 standard drinks  ?  Comment: occasionally  ? Drug use: No  ? ? ? ?Allergies   ?Patient has  no known allergies. ? ? ?Review of Systems ?Review of Systems ?Per HPI ? ?Physical Exam ?Triage Vital Signs ?ED Triage Vitals  ?Enc Vitals Group  ?   BP 08/09/21 1852 124/79  ?   Pulse Rate 08/09/21 1852 100  ?   Resp 08/09/21 1852 16  ?   Temp 08/09/21 1852 98.6 ?F (37 ?C)  ?   Temp Source 08/09/21 1852 Oral  ?   SpO2 08/09/21 1852 94 %  ?   Weight --   ?   Height --   ?   Head Circumference --   ?   Peak Flow --   ?   Pain Score 08/09/21 1853 3  ?   Pain Loc --   ?   Pain Edu? --   ?   Excl. in GC? --   ? ?No data found. ? ?Updated Vital Signs ?BP 124/79 (BP Location: Left Arm)   Pulse 100   Temp 98.6 ?F (37 ?C) (Oral)   Resp 16   SpO2 94%  ? ?Visual Acuity ?Right Eye Distance:   ?Left Eye Distance:   ?Bilateral Distance:   ? ?Right Eye Near:   ?Left Eye Near:    ?Bilateral Near:    ? ?Physical Exam ?Constitutional:   ?  General: He is not in acute distress. ?   Appearance: Normal appearance. He is not toxic-appearing or diaphoretic.  ?HENT:  ?   Head: Normocephalic and atraumatic.  ?   Right Ear: Tympanic membrane and ear canal normal.  ?   Left Ear: Tympanic membrane and ear canal normal.  ?   Nose: Congestion present.  ?   Mouth/Throat:  ?   Mouth: Mucous membranes are moist.  ?   Pharynx: No posterior oropharyngeal erythema.  ?Eyes:  ?   Extraocular Movements: Extraocular movements intact.  ?   Conjunctiva/sclera: Conjunctivae normal.  ?   Pupils: Pupils are equal, round, and reactive to light.  ?Cardiovascular:  ?   Rate and Rhythm: Normal rate and regular rhythm.  ?   Pulses: Normal pulses.  ?   Heart sounds: Normal heart sounds.  ?Pulmonary:  ?   Effort: Pulmonary effort is normal. No respiratory distress.  ?   Breath sounds: Normal breath sounds. No stridor. No wheezing, rhonchi or rales.  ?Abdominal:  ?   General: Abdomen is flat. Bowel sounds are normal.  ?   Palpations: Abdomen is soft.  ?Musculoskeletal:     ?   General: Normal range of motion.  ?   Cervical back: Normal range of motion.  ?Skin: ?    General: Skin is warm and dry.  ?Neurological:  ?   General: No focal deficit present.  ?   Mental Status: He is alert and oriented to person, place, and time. Mental status is at baseline.  ?Psychiatric:     ?   Mood and Affect: Mood normal.     ?   Behavior: Behavior normal.  ? ? ? ?UC Treatments / Results  ?Labs ?(all labs ordered are listed, but only abnormal results are displayed) ?Labs Reviewed  ?NOVEL CORONAVIRUS, NAA  ? ? ?EKG ? ? ?Radiology ?No results found. ? ?Procedures ?Procedures (including critical care time) ? ?Medications Ordered in UC ?Medications - No data to display ? ?Initial Impression / Assessment and Plan / UC Course  ?I have reviewed the triage vital signs and the nursing notes. ? ?Pertinent labs & imaging results that were available during my care of the patient were reviewed by me and considered in my medical decision making (see chart for details). ? ?  ? ?Patient presents with symptoms likely from a viral upper respiratory infection. Differential includes bacterial pneumonia, sinusitis, allergic rhinitis, COVID-19, flu. Do not suspect underlying cardiopulmonary process. Symptoms seem unlikely related to ACS, CHF or COPD exacerbations, pneumonia, pneumothorax. Patient is nontoxic appearing and not in need of emergent medical intervention.  COVID test pending. ? ?Recommended symptom control with over the counter medications: Daily oral anti-histamine, Oral decongestant or IN corticosteroid, saline irrigations, cepacol lozenges, Robitussin, Delsym, honey tea.  Patient sent prescriptions. ? ?Return if symptoms fail to improve in 1-2 weeks or you develop shortness of breath, chest pain, severe headache. Patient states understanding and is agreeable. ? ?Discharged with PCP followup.  ?Final Clinical Impressions(s) / UC Diagnoses  ? ?Final diagnoses:  ?Viral upper respiratory tract infection with cough  ? ? ? ?Discharge Instructions   ? ?  ?It appears that you have a viral upper respiratory  infection that should self resolve in the next few days with symptomatic treatment.  You have been prescribed 2 medications to alleviate symptoms.  Please follow-up if symptoms persist or worsen.  COVID test is pending.  We will call only if it is positive. ? ? ? ?  ED Prescriptions   ? ? Medication Sig Dispense Auth. Provider  ? benzonatate (TESSALON) 100 MG capsule Take 1 capsule (100 mg total) by mouth every 8 (eight) hours as needed for cough. 21 capsule Gustavus Bryant, [place]  ? fluticasone (FLONASE) 50 MCG/ACT nasal spray Place 1 spray into both nostrils daily for 3 days. 16 g Gustavus Bryant, [place]  ? ?  ? ?PDMP not reviewed this encounter. ?  ?Gustavus Bryant, [place] ?08/09/21 1914 ? ?

## 2021-08-09 NOTE — Discharge Instructions (Signed)
It appears that you have a viral upper respiratory infection that should self resolve in the next few days with symptomatic treatment.  You have been prescribed 2 medications to alleviate symptoms.  Please follow-up if symptoms persist or worsen.  COVID test is pending.  We will call only if it is positive. ?

## 2021-08-10 LAB — NOVEL CORONAVIRUS, NAA: SARS-CoV-2, NAA: NOT DETECTED

## 2021-12-13 DIAGNOSIS — Z6834 Body mass index (BMI) 34.0-34.9, adult: Secondary | ICD-10-CM | POA: Diagnosis not present

## 2021-12-13 DIAGNOSIS — H538 Other visual disturbances: Secondary | ICD-10-CM | POA: Diagnosis not present

## 2021-12-13 DIAGNOSIS — E669 Obesity, unspecified: Secondary | ICD-10-CM | POA: Diagnosis not present

## 2021-12-13 DIAGNOSIS — Z0000 Encounter for general adult medical examination without abnormal findings: Secondary | ICD-10-CM | POA: Diagnosis not present

## 2021-12-13 DIAGNOSIS — Z79899 Other long term (current) drug therapy: Secondary | ICD-10-CM | POA: Diagnosis not present

## 2021-12-13 DIAGNOSIS — J309 Allergic rhinitis, unspecified: Secondary | ICD-10-CM | POA: Diagnosis not present

## 2022-01-03 DIAGNOSIS — Z0001 Encounter for general adult medical examination with abnormal findings: Secondary | ICD-10-CM | POA: Diagnosis not present

## 2022-01-03 DIAGNOSIS — Z23 Encounter for immunization: Secondary | ICD-10-CM | POA: Diagnosis not present

## 2022-01-03 DIAGNOSIS — Z79899 Other long term (current) drug therapy: Secondary | ICD-10-CM | POA: Diagnosis not present

## 2022-01-03 DIAGNOSIS — E781 Pure hyperglyceridemia: Secondary | ICD-10-CM | POA: Diagnosis not present

## 2022-01-03 DIAGNOSIS — H029 Unspecified disorder of eyelid: Secondary | ICD-10-CM | POA: Diagnosis not present

## 2022-01-03 DIAGNOSIS — Z9109 Other allergy status, other than to drugs and biological substances: Secondary | ICD-10-CM | POA: Diagnosis not present

## 2022-01-03 DIAGNOSIS — E785 Hyperlipidemia, unspecified: Secondary | ICD-10-CM | POA: Diagnosis not present

## 2022-01-03 DIAGNOSIS — E669 Obesity, unspecified: Secondary | ICD-10-CM | POA: Diagnosis not present

## 2023-03-14 ENCOUNTER — Other Ambulatory Visit: Payer: Self-pay | Admitting: Family Medicine

## 2023-03-14 DIAGNOSIS — Z1211 Encounter for screening for malignant neoplasm of colon: Secondary | ICD-10-CM

## 2023-03-14 DIAGNOSIS — Z1212 Encounter for screening for malignant neoplasm of rectum: Secondary | ICD-10-CM

## 2023-08-08 ENCOUNTER — Other Ambulatory Visit: Payer: Self-pay

## 2023-08-08 ENCOUNTER — Emergency Department (HOSPITAL_COMMUNITY): Payer: Medicare PPO

## 2023-08-08 ENCOUNTER — Emergency Department (HOSPITAL_COMMUNITY)
Admission: EM | Admit: 2023-08-08 | Discharge: 2023-08-08 | Disposition: A | Discharge status: Home / Self Care | Payer: Medicare PPO | Attending: Emergency Medicine | Admitting: Emergency Medicine

## 2023-08-08 ENCOUNTER — Encounter (HOSPITAL_COMMUNITY): Payer: Self-pay

## 2023-08-08 DIAGNOSIS — M7989 Other specified soft tissue disorders: Secondary | ICD-10-CM | POA: Diagnosis not present

## 2023-08-08 DIAGNOSIS — M79604 Pain in right leg: Secondary | ICD-10-CM | POA: Diagnosis present

## 2023-08-08 NOTE — ED Provider Notes (Signed)
 [place] [facility] AT [facility] Provider Note   CSN: [id number] Arrival date & time: 08/08/23  1257     History Chief Complaint  Patient presents with   Leg Pain    Richard Neal is a 68 y.o. male.  Patient presents to the emergency department concerns of leg pain.  He reports that the posterior right leg is not pain and tenderness primarily in the calf and the posterior knee.  No recent injury, trauma or falls.  No prior history of PE or DVT.  Not on blood thinners.  Denies any alteration in feeling or sensation in the lower extremity.  Denies any difficulty with walking.  Does report that walking typically worsens his pain and will improve with rest.  Denies any changes in color of the lower extremities.   Leg Pain      Home Medications Prior to Admission medications   Medication Sig Start Date End Date Taking? Authorizing Provider  benzonatate (TESSALON) 100 MG capsule Take 1 capsule (100 mg total) by mouth every 8 (eight) hours as needed for cough. 08/09/21   Gustavus Bryant, FNP  EPINEPHrine (EPIPEN 2-PAK) 0.3 mg/0.3 mL IJ SOAJ injection Inject 0.3 mLs (0.3 mg total) into the muscle once. 11/26/14   Ethelda Chick, MD  fluticasone Aleda Grana) 50 MCG/ACT nasal spray Place 1 spray into both nostrils daily for 3 days. 08/09/21 08/12/21  Gustavus Bryant, FNP  ibuprofen (ADVIL) 200 MG tablet Take by mouth.    [provider]      Allergies    Patient has no known allergies.    Review of Systems   Review of Systems  Cardiovascular:  Positive for leg swelling.  All other systems reviewed and are negative.   Physical Exam Updated Vital Signs BP (!) 155/72 (BP Location: Right Arm)   Pulse 92   Temp 98.2 F (36.8 C) (Oral)   Resp 18   Ht 5\' 11"  (1.803 m)   Wt 111.4 kg   SpO2 99%   BMI 34.25 kg/m  Physical Exam Vitals and nursing note reviewed.  Constitutional:      General: He is not in acute distress.    Appearance: He is well-developed.   HENT:     Head: Normocephalic and atraumatic.  Eyes:     Conjunctiva/sclera: Conjunctivae normal.  Cardiovascular:     Rate and Rhythm: Normal rate and regular rhythm.     Pulses:          Dorsalis pedis pulses are 1+ on the right side.       Posterior tibial pulses are 1+ on the right side.     Heart sounds: No murmur heard.    Comments: Faint pulses at the right DP and PT, clearly auscultated on Doppler Pulmonary:     Effort: Pulmonary effort is normal. No respiratory distress.     Breath sounds: Normal breath sounds.  Abdominal:     Palpations: Abdomen is soft.     Tenderness: There is no abdominal tenderness.  Musculoskeletal:        General: No swelling.     Cervical back: Neck supple.  Skin:    General: Skin is warm and dry.     Capillary Refill: Capillary refill takes less than 2 seconds.  Neurological:     Mental Status: He is alert.  Psychiatric:        Mood and Affect: Mood normal.     ED Results / Procedures / Treatments  Labs (all labs ordered are listed, but only abnormal results are displayed) Labs Reviewed - No data to display  EKG None  Radiology VAS [place] LOWER EXTREMITY VENOUS (DVT) (7a-7p) Result Date: 08/08/2023  Lower Venous DVT Study Patient Name:  Richard Neal  Date of Exam:   08/08/2023 Medical Rec #: [id number]         Accession #:    [id number] Date of Birth: 1956-01-26         Patient Gender: M Patient Age:   79 years Exam Location:  [facility] Procedure:      VAS [place] LOWER EXTREMITY VENOUS (DVT) Referring Phys: Maryanna Shape --------------------------------------------------------------------------------  Indications: Swelling, and Edema.  Comparison Study: No prior exam. Performing Technologist: Fernande Bras  Examination Guidelines: A complete evaluation includes B-mode imaging, spectral Doppler, color Doppler, and power Doppler as needed of all accessible portions of each vessel. Bilateral testing is considered an integral part of  a complete examination. Limited examinations for reoccurring indications may be performed as noted. The reflux portion of the exam is performed with the patient in reverse Trendelenburg.  +---------+---------------+---------+-----------+----------+-------------------+ RIGHT    CompressibilityPhasicitySpontaneityPropertiesThrombus Aging      +---------+---------------+---------+-----------+----------+-------------------+ CFV      Full           Yes      Yes                                      +---------+---------------+---------+-----------+----------+-------------------+ SFJ      Full           Yes      Yes                                      +---------+---------------+---------+-----------+----------+-------------------+ FV Prox  Full                                                             +---------+---------------+---------+-----------+----------+-------------------+ FV Mid   Full                                                             +---------+---------------+---------+-----------+----------+-------------------+ FV DistalFull                                                             +---------+---------------+---------+-----------+----------+-------------------+ PFV      Full                                                             +---------+---------------+---------+-----------+----------+-------------------+ POP      Full  Yes      Yes                                      +---------+---------------+---------+-----------+----------+-------------------+ PTV                                                   Not well visualized +---------+---------------+---------+-----------+----------+-------------------+ PERO                                                  Not well visualized +---------+---------------+---------+-----------+----------+-------------------+    +---------+---------------+---------+-----------+----------+----------------+ LEFT     CompressibilityPhasicitySpontaneityPropertiesThrombus Aging   +---------+---------------+---------+-----------+----------+----------------+ CFV      Full           Yes      Yes                                   +---------+---------------+---------+-----------+----------+----------------+ SFJ      Full           Yes      Yes                                   +---------+---------------+---------+-----------+----------+----------------+ FV Prox  Full                                                          +---------+---------------+---------+-----------+----------+----------------+ FV Mid   Full                                                          +---------+---------------+---------+-----------+----------+----------------+ FV DistalFull                                                          +---------+---------------+---------+-----------+----------+----------------+ PFV      Full                                                          +---------+---------------+---------+-----------+----------+----------------+ POP      Full           Yes      Yes                                   +---------+---------------+---------+-----------+----------+----------------+ PTV  Patent by color. +---------+---------------+---------+-----------+----------+----------------+ PERO                                                  Patent by color. +---------+---------------+---------+-----------+----------+----------------+    Summary: RIGHT: - There is no evidence of deep vein thrombosis in the lower extremity. However, portions of this examination were limited- see technologist comments above.  - No cystic structure found in the popliteal fossa.  LEFT: - There is no evidence of deep vein thrombosis in the lower extremity.  - No  cystic structure found in the popliteal fossa.  *See table(s) above for measurements and observations.    Preliminary     Procedures Procedures    Medications Ordered in ED Medications - No data to display  ED Course/ Medical Decision Making/ A&P                                 Medical Decision Making  This patient presents to the ED for concern of leg pain.  Differential diagnosis includes claudication, PAD, DVT,   Imaging Studies ordered:  I ordered imaging studies including ultrasound of the lower extremities I independently visualized and interpreted imaging which showed negative for any findings of DVT, cysts, or other concerning abnormalities I agree with the radiologist interpretation   Problem List / ED Course:  Patient presents ED with concerns of leg pain.  Past history significant for arthritis, anxiety, depression.  He denies any chest pain shortness of breath.  States that he notices leg swelling over the last several days not notably improving.  He is on blood thinners and no history of PE or DVT.  States that the pain is typically worsened with ambulation and improves with rest.  Pain is in the right calf and right posterior knee.  Denies any recent injury, trauma, prolonged sitting or standing, no recent surgical intervention. Exam reveals some tenderness to the posterior right knee.  He reports typically the pain is worse along with walking.  PT and DP pulses in the right lower extremity are +1 and clearly auscultated with Doppler.  Potentially has a component of PAD which is more chronic in nature but acutely worsening.  Will obtain ultrasound imaging to rule out any possible source of DVT.  If imaging is reassuring, advised patient he likely require outpatient vascular surgery follow-up for further management. Ultrasound imaging is reassuring without any findings of DVT in bilateral lower extremities.  Patient symptoms do appear to be somewhat claudication type in  nature.  In the patient's pulses are palpable and clear distinguish on Doppler, does not appear that he is currently having ischemic limb.  Advised patient that he would likely benefit from outpatient follow-up with vascular surgery.  Discussed return precautions such as worsening leg pain, pulselessness, or inability to use the limb.  Patient otherwise stable for outpatient follow-up and discharge home.  Verbalized understanding all return precautions.  Final Clinical Impression(s) / ED Diagnoses Final diagnoses:  Right leg pain    Rx / DC Orders ED Discharge Orders     None         Smitty Knudsen, PA-C 08/08/23 1727    Melene Plan, DO 08/10/23 2257

## 2023-08-08 NOTE — Discharge Instructions (Addendum)
 You are seen in the emergency department today for concerns of leg pain.  He had palpable pulses are also easily on our Doppler machine.  Your ultrasound was negative for any signs of blood clot in your leg.  I would recommend following up with primary care provider for repeat evaluation also with the vascular surgery team for assessment of possible peripheral artery disease.  For any concerns of new or worsening symptoms, severe and some increasing pain, return the emergency department.

## 2023-08-08 NOTE — ED Triage Notes (Signed)
 Patient reports that he has a pain in the back of his leg in the right calf. Denies any redness or swelling. Reports the pain started this morning and only has pain when walking.

## 2023-09-19 ENCOUNTER — Ambulatory Visit: Admitting: Gastroenterology

## 2023-09-19 ENCOUNTER — Encounter: Payer: Self-pay | Admitting: Gastroenterology

## 2023-09-19 VITALS — BP 150/80 | HR 74 | Ht 71.0 in | Wt 245.0 lb

## 2023-09-19 DIAGNOSIS — Z860100 Personal history of colon polyps, unspecified: Secondary | ICD-10-CM | POA: Diagnosis not present

## 2023-09-19 DIAGNOSIS — R195 Other fecal abnormalities: Secondary | ICD-10-CM | POA: Diagnosis not present

## 2023-09-19 NOTE — Progress Notes (Signed)
 Chief Complaint: +cologuard  Referring Provider:  Ellyn Hack, MD      ASSESSMENT AND PLAN;   #1. + cologuard  #2. H/O polyps  Plan: -Colon    Discussed risks & benefits of colonoscopy. Risks including rare perforation req laparotomy, bleeding after bx/polypectomy req blood transfusion, rarely missing neoplasms, risks of anesthesia/sedation, rare risk of damage to internal organs. Benefits outweigh the risks. Patient agrees to proceed. All the questions were answered. Pt consents to proceed.    HPI:    Richard Neal is a 68 y.o. male   History of Present Illness Richard Neal is a 68 year old male with history of HTN, anxiety/depression, osteoarthritis, HLD who presents for a positive Cologuard test.  No nausea, vomiting, heartburn, regurgitation, odynophagia or dysphagia.  No significant diarrhea or constipation.  No melena or hematochezia. No unintentional weight loss. No abdominal pain.  He had a colonoscopy in 2012 in [place], during which one polyp was found.   He mentions having a hemorrhoid at the anus, described as a 'little bubble' that is not currently bothersome. 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  He has a history of hypertension, managed with a low-dose medication, and hyperlipidemia, for which he takes medication. He also uses Flonase and Zyrtec for allergies, and takes essential vitamins for men over fifty.  His family history is negative for colon cancer or colon polyps.  He is a Hydrologist, which requires physical activity such as climbing steps and using his upper body. He denies any heart or lung problems, and reports sleeping well without any issues of sleep apnea.    Past GI workup: Colon 2012- one polyp- EGI-report awaited  Past Medical History:  Diagnosis Date   Anxiety    Arthritis    degenerative disc disease lumbar    Depression     Past Surgical History:  Procedure Laterality Date   BACK SURGERY  2007 and 2010   FOOT SURGERY      Family History  Problem Relation Age of Onset   Clotting disorder Mother    Liver disease Neg Hx    Colon cancer Neg Hx    Esophageal cancer Neg Hx     Social History   Tobacco Use   Smoking status: Former    Current packs/day: 0.00    Types: Cigarettes    Quit date: 05/10/1999    Years since quitting: 24.3   Smokeless tobacco: Never  Vaping Use   Vaping status: Never Used  Substance Use Topics   Alcohol use: Yes    Alcohol/week: 0.0 standard drinks of alcohol    Comment: occasionally   Drug use: No    Current Outpatient Medications  Medication Sig Dispense Refill   amLODipine (NORVASC) 2.5 MG tablet Take 2.5 mg by mouth daily.     EPINEPHrine (EPIPEN 2-PAK) 0.3 mg/0.3 mL IJ SOAJ injection Inject 0.3 mLs (0.3 mg total) into the muscle once. 1 Device 1   ibuprofen (ADVIL) 200 MG tablet Take by mouth.     rosuvastatin (CRESTOR) 20 MG tablet Take 20 mg by mouth at bedtime.     benzonatate (TESSALON) 100 MG capsule Take 1 capsule (100 mg total) by mouth every 8 (eight) hours as needed for cough. (Patient not taking: Reported on 09/19/2023) 21 capsule 0   fluticasone (FLONASE) 50 MCG/ACT nasal spray Place 1 spray into both nostrils daily for 3  days. 16 g 0   No current facility-administered medications for this visit.    No Known Allergies  Review of Systems:  Constitutional: Denies fever, chills, diaphoresis, appetite change and fatigue.  HEENT: neg   Respiratory: Denies SOB, DOE, cough, chest tightness,  and wheezing.   Cardiovascular: Denies chest pain, palpitations and leg swelling.  Genitourinary: Denies dysuria, urgency, frequency, hematuria, flank pain and difficulty urinating.  Musculoskeletal: Denies myalgias, back pain, joint swelling, arthralgias and gait problem.  Has arthritis. Skin: No rash.  Neurological: Denies dizziness, seizures,  syncope, weakness, light-headedness, numbness and headaches.  Hematological: Denies adenopathy. Easy bruising, personal or family bleeding history  Psychiatric/Behavioral: No anxiety or depression     Physical Exam:    BP (!) 150/80   Pulse 74   Ht 5\' 11"  (1.803 m)   Wt 245 lb (111.1 kg)   BMI 34.17 kg/m  Wt Readings from Last 3 Encounters:  09/19/23 245 lb (111.1 kg)  08/08/23 245 lb 9.5 oz (111.4 kg)  07/05/21 245 lb 9.6 oz (111.4 kg)   Constitutional:  Well-developed, in no acute distress. Psychiatric: Normal mood and affect. Behavior is normal. HEENT: Pupils normal.  Conjunctivae are normal. No scleral icterus. Cardiovascular: Normal rate, regular rhythm. No edema Pulmonary/chest: Effort normal and breath sounds normal. No wheezing, rales or rhonchi. Abdominal: Soft, nondistended. Nontender. Bowel sounds active throughout. There are no masses palpable. No hepatomegaly. Rectal: Deferred Neurological: Alert and oriented to person place and time. Skin: Skin is warm and dry. No rashes noted.  Data Reviewed: I have personally reviewed following labs and imaging studies  CBC:    Latest Ref Rng & Units 11/26/2014    4:10 PM 12/30/2010    1:04 PM 12/30/2010   12:53 PM  CBC  WBC 4.6 - 10.2 K/uL 9.3   6.8   Hemoglobin 14.1 - 18.1 g/dL 84.6  96.2  95.2   Hematocrit 43.5 - 53.7 % 42.8  40.0  39.1   Platelets 150 - 400 K/uL   161     CMP:    Latest Ref Rng & Units 07/05/2021   11:09 AM 11/26/2014    4:06 PM 12/30/2010    1:04 PM  CMP  Glucose 70 - 99 mg/dL 841  88  93   BUN 8 - 27 mg/dL 13  16  13    Creatinine 0.76 - 1.27 mg/dL 3.24  4.01  0.27   Sodium 134 - 144 mmol/L 142  141  142   Potassium 3.5 - 5.2 mmol/L 4.5  4.1  4.5   Chloride 96 - 106 mmol/L 100  103  102   CO2 20 - 29 mmol/L 24  28    Calcium 8.6 - 10.2 mg/dL 9.4  8.8    Total Protein 6.0 - 8.5 g/dL 6.4  6.5    Total Bilirubin 0.0 - 1.2 mg/dL 0.4  0.6    Alkaline Phos 44 - 121 IU/L 58  54    AST 0 - 40 IU/L  18  17    ALT 0 - 44 IU/L 24  18         Edman Circle, MD 09/19/2023, 9:10 AM  Cc: Ellyn Hack, MD

## 2023-09-19 NOTE — Patient Instructions (Signed)
 _______________________________________________________  If your blood pressure at your visit was 140/90 or greater, please contact your primary care physician to follow up on this.  _______________________________________________________  If you are age 68 or older, your body mass index should be between 23-30. Your Body mass index is 34.17 kg/m. If this is out of the aforementioned range listed, please consider follow up with your Primary Care Provider.  If you are age 61 or younger, your body mass index should be between 19-25. Your Body mass index is 34.17 kg/m. If this is out of the aformentioned range listed, please consider follow up with your Primary Care Provider.   ________________________________________________________  The [facility] providers would like to encourage you to use [facility] to communicate with providers for non-urgent requests or questions.  Due to long hold times on the telephone, sending your provider a message by [facility] may be a faster and more efficient way to get a response.  Please allow 48 business hours for a response.  Please remember that this is for non-urgent requests.  _______________________________________________________  Richard Neal have been scheduled for a colonoscopy. Please follow written instructions given to you at your visit today.   If you use inhalers (even only as needed), please bring them with you on the day of your procedure.  DO NOT TAKE 7 DAYS PRIOR TO TEST- Trulicity (dulaglutide) Ozempic, Wegovy (semaglutide) Mounjaro (tirzepatide) Bydureon Bcise (exanatide extended release)  DO NOT TAKE 1 DAY PRIOR TO YOUR TEST Rybelsus (semaglutide) Adlyxin (lixisenatide) Victoza (liraglutide) Byetta (exanatide) ___________________________________________________________________________  Thank you,  Dr. Lynann Bologna

## 2023-10-03 ENCOUNTER — Ambulatory Visit: Admitting: Gastroenterology

## 2023-10-03 ENCOUNTER — Encounter: Payer: Self-pay | Admitting: Gastroenterology

## 2023-10-03 VITALS — BP 146/79 | HR 90 | Temp 98.3°F | Resp 16 | Ht 71.0 in | Wt 245.0 lb

## 2023-10-03 DIAGNOSIS — Z1211 Encounter for screening for malignant neoplasm of colon: Secondary | ICD-10-CM | POA: Diagnosis not present

## 2023-10-03 DIAGNOSIS — K640 First degree hemorrhoids: Secondary | ICD-10-CM

## 2023-10-03 DIAGNOSIS — D128 Benign neoplasm of rectum: Secondary | ICD-10-CM | POA: Diagnosis not present

## 2023-10-03 DIAGNOSIS — K635 Polyp of colon: Secondary | ICD-10-CM

## 2023-10-03 DIAGNOSIS — R195 Other fecal abnormalities: Secondary | ICD-10-CM

## 2023-10-03 DIAGNOSIS — D122 Benign neoplasm of ascending colon: Secondary | ICD-10-CM

## 2023-10-03 DIAGNOSIS — D123 Benign neoplasm of transverse colon: Secondary | ICD-10-CM

## 2023-10-03 DIAGNOSIS — K5730 Diverticulosis of large intestine without perforation or abscess without bleeding: Secondary | ICD-10-CM

## 2023-10-03 DIAGNOSIS — D124 Benign neoplasm of descending colon: Secondary | ICD-10-CM

## 2023-10-03 DIAGNOSIS — D125 Benign neoplasm of sigmoid colon: Secondary | ICD-10-CM

## 2023-10-03 DIAGNOSIS — Z860100 Personal history of colon polyps, unspecified: Secondary | ICD-10-CM

## 2023-10-03 MED ORDER — SODIUM CHLORIDE 0.9 % IV SOLN: 500.0000 mL | INTRAVENOUS | Status: DC

## 2023-10-03 NOTE — Progress Notes (Signed)
 Pt's states no medical or surgical changes since previsit or office visit.

## 2023-10-03 NOTE — Progress Notes (Signed)
 Called to room to assist during endoscopic procedure.  Patient ID and intended procedure confirmed with present staff. Received instructions for my participation in the procedure from the performing physician.

## 2023-10-03 NOTE — Progress Notes (Signed)
 Chief Complaint: +cologuard  Referring Provider:  Ulysees Gander, MD      ASSESSMENT AND PLAN;   #1. + cologuard  #2. H/O polyps  Plan: -Colon    Discussed risks & benefits of colonoscopy. Risks including rare perforation req laparotomy, bleeding after bx/polypectomy req blood transfusion, rarely missing neoplasms, risks of anesthesia/sedation, rare risk of damage to internal organs. Benefits outweigh the risks. Patient agrees to proceed. All the questions were answered. Pt consents to proceed.    HPI:    Richard Neal is a 68 y.o. male   History of Present Illness Richard Neal is a 68 year old male with history of HTN, anxiety/depression, osteoarthritis, HLD who presents for a positive Cologuard test.  No nausea, vomiting, heartburn, regurgitation, odynophagia or dysphagia.  No significant diarrhea or constipation.  No melena or hematochezia. No unintentional weight loss. No abdominal pain.  He had a colonoscopy in 2012 in [place], during which one polyp was found.   He mentions having a hemorrhoid at the anus, described as a 'little bubble' that is not currently bothersome. 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  He has a history of hypertension, managed with a low-dose medication, and hyperlipidemia, for which he takes medication. He also uses Flonase  and Zyrtec  for allergies, and takes essential vitamins for men over fifty.  His family history is negative for colon cancer or colon polyps.  He is a Hydrologist, which requires physical activity such as climbing steps and using his upper body. He denies any heart or lung problems, and reports sleeping well without any issues of sleep apnea.    Past GI workup: Colon 2012- one polyp- EGI-report awaited  Past Medical History:  Diagnosis Date   Anxiety    Arthritis    degenerative disc disease lumbar    Depression     Past Surgical History:  Procedure Laterality Date   BACK SURGERY  2007 and 2010   FOOT SURGERY      Family History  Problem Relation Age of Onset   Clotting disorder Mother    Liver disease Neg Hx    Colon cancer Neg Hx    Esophageal cancer Neg Hx     Social History   Tobacco Use   Smoking status: Former    Current packs/day: 0.00    Types: Cigarettes    Quit date: 05/10/1999    Years since quitting: 24.4   Smokeless tobacco: Never  Vaping Use   Vaping status: Never Used  Substance Use Topics   Alcohol use: Yes    Alcohol/week: 0.0 standard drinks of alcohol    Comment: occasionally   Drug use: No    Current Outpatient Medications  Medication Sig Dispense Refill   amLODipine (NORVASC) 2.5 MG tablet Take 2.5 mg by mouth daily.     rosuvastatin (CRESTOR) 20 MG tablet Take 20 mg by mouth at bedtime.     benzonatate  (TESSALON ) 100 MG capsule Take 1 capsule (100 mg total) by mouth every 8 (eight) hours as needed for cough. (Patient not taking: Reported on 10/03/2023) 21 capsule 0   EPINEPHrine  (EPIPEN  2-PAK) 0.3 mg/0.3 mL IJ SOAJ injection Inject 0.3 mLs (0.3 mg total) into the muscle once. 1 Device 1   fluticasone  (FLONASE ) 50 MCG/ACT nasal spray Place 1 spray into both nostrils daily for 3 days. 16 g 0   ibuprofen (ADVIL) 200 MG tablet Take  by mouth.     Current Facility-Administered Medications  Medication Dose Route Frequency Provider Last Rate Last Admin   0.9 %  sodium chloride infusion  500 mL Intravenous Continuous Lajuan Pila, MD        No Known Allergies  Review of Systems:  Constitutional: Denies fever, chills, diaphoresis, appetite change and fatigue.  HEENT: neg   Respiratory: Denies SOB, DOE, cough, chest tightness,  and wheezing.   Cardiovascular: Denies chest pain, palpitations and leg swelling.  Genitourinary: Denies dysuria, urgency, frequency, hematuria, flank pain and difficulty urinating.  Musculoskeletal: Denies myalgias, back  pain, joint swelling, arthralgias and gait problem.  Has arthritis. Skin: No rash.  Neurological: Denies dizziness, seizures, syncope, weakness, light-headedness, numbness and headaches.  Hematological: Denies adenopathy. Easy bruising, personal or family bleeding history  Psychiatric/Behavioral: No anxiety or depression     Physical Exam:    BP (!) 153/77   Pulse 77   Temp 98.3 F (36.8 C)   Ht 5\' 11"  (1.803 m)   Wt 245 lb (111.1 kg)   SpO2 96%   BMI 34.17 kg/m  Wt Readings from Last 3 Encounters:  10/03/23 245 lb (111.1 kg)  09/19/23 245 lb (111.1 kg)  08/08/23 245 lb 9.5 oz (111.4 kg)   Constitutional:  Well-developed, in no acute distress. Psychiatric: Normal mood and affect. Behavior is normal. HEENT: Pupils normal.  Conjunctivae are normal. No scleral icterus. Cardiovascular: Normal rate, regular rhythm. No edema Pulmonary/chest: Effort normal and breath sounds normal. No wheezing, rales or rhonchi. Abdominal: Soft, nondistended. Nontender. Bowel sounds active throughout. There are no masses palpable. No hepatomegaly. Rectal: Deferred Neurological: Alert and oriented to person place and time. Skin: Skin is warm and dry. No rashes noted.  Data Reviewed: I have personally reviewed following labs and imaging studies  CBC:    Latest Ref Rng & Units 11/26/2014    4:10 PM 12/30/2010    1:04 PM 12/30/2010   12:53 PM  CBC  WBC 4.6 - 10.2 K/uL 9.3   6.8   Hemoglobin 14.1 - 18.1 g/dL 16.1  09.6  04.5   Hematocrit 43.5 - 53.7 % 42.8  40.0  39.1   Platelets 150 - 400 K/uL   161     CMP:    Latest Ref Rng & Units 07/05/2021   11:09 AM 11/26/2014    4:06 PM 12/30/2010    1:04 PM  CMP  Glucose 70 - 99 mg/dL 409  88  93   BUN 8 - 27 mg/dL 13  16  13    Creatinine 0.76 - 1.27 mg/dL 8.11  9.14  7.82   Sodium 134 - 144 mmol/L 142  141  142   Potassium 3.5 - 5.2 mmol/L 4.5  4.1  4.5   Chloride 96 - 106 mmol/L 100  103  102   CO2 20 - 29 mmol/L 24  28    Calcium 8.6 - 10.2  mg/dL 9.4  8.8    Total Protein 6.0 - 8.5 g/dL 6.4  6.5    Total Bilirubin 0.0 - 1.2 mg/dL 0.4  0.6    Alkaline Phos 44 - 121 IU/L 58  54    AST 0 - 40 IU/L 18  17    ALT 0 - 44 IU/L 24  18         Magnus Schuller, MD 10/03/2023, 3:22 PM  Cc: Ulysees Gander, MD

## 2023-10-03 NOTE — Patient Instructions (Signed)
 Educational handout provided to patient related to Hemorrhoids, Polyps, and Diverticulosis  Resume previous diet  Continue present medications- NO ASPIRIN, IBUPROFEN, NAPROXEN, OR OTHER NON-STEROIDAL ANTI-INFLAMMATORY DRUGS FOR 5 DAYS AFTER POLYP REMOVAL  Awaiting pathology results  YOU HAD AN ENDOSCOPIC PROCEDURE TODAY AT THE [facility]:   Refer to the procedure report that was given to you for any specific questions about what was found during the examination.  If the procedure report does not answer your questions, please call your gastroenterologist to clarify.  If you requested that your care partner not be given the details of your procedure findings, then the procedure report has been included in a sealed envelope for you to review at your convenience later.  YOU SHOULD EXPECT: Some feelings of bloating in the abdomen. Passage of more gas than usual.  Walking can help get rid of the air that was put into your GI tract during the procedure and reduce the bloating. If you had a lower endoscopy (such as a colonoscopy or flexible sigmoidoscopy) you may notice spotting of blood in your stool or on the toilet paper. If you underwent a bowel prep for your procedure, you may not have a normal bowel movement for a few days.  Please Note:  You might notice some irritation and congestion in your nose or some drainage.  This is from the oxygen used during your procedure.  There is no need for concern and it should clear up in a day or so.  SYMPTOMS TO REPORT IMMEDIATELY:  Following lower endoscopy (colonoscopy or flexible sigmoidoscopy):  Excessive amounts of blood in the stool  Significant tenderness or worsening of abdominal pains  Swelling of the abdomen that is new, acute  Fever of 100F or higher  For urgent or emergent issues, a gastroenterologist can be reached at any hour by calling [phone]. Do not use MyChart messaging for urgent concerns.    DIET:  We do recommend  a small meal at first, but then you may proceed to your regular diet.  Drink plenty of fluids but you should avoid alcoholic beverages for 24 hours.  ACTIVITY:  You should plan to take it easy for the rest of today and you should NOT DRIVE or use heavy machinery until tomorrow (because of the sedation medicines used during the test).    FOLLOW UP: Our staff will call the number listed on your records the next business day following your procedure.  We will call around 7:15- 8:00 am to check on you and address any questions or concerns that you may have regarding the information given to you following your procedure. If we do not reach you, we will leave a message.     If any biopsies were taken you will be contacted by phone or by letter within the next 1-3 weeks.  Please call us  at [phone] if you have not heard about the biopsies in 3 weeks.    SIGNATURES/CONFIDENTIALITY: You and/or your care partner have signed paperwork which will be entered into your electronic medical record.  These signatures attest to the fact that that the information above on your After Visit Summary has been reviewed and is understood.  Full responsibility of the confidentiality of this discharge information lies with you and/or your care-partner.

## 2023-10-03 NOTE — Op Note (Signed)
 [facility] Patient Name: Richard Neal Procedure Date: 10/03/2023 3:23 PM MRN: [id number] Endoscopist: Lajuan Pila , MD, [id number] Age: 68 Referring MD:  Date of Birth: 04/14/1956 Gender: Male Account #: [id number] Procedure:                Colonoscopy Indications:              Positive Cologuard test Medicines:                Monitored Anesthesia Care Procedure:                Pre-Anesthesia Assessment:                           - Prior to the procedure, a History and Physical                            was performed, and patient medications and                            allergies were reviewed. The patient's tolerance of                            previous anesthesia was also reviewed. The risks                            and benefits of the procedure and the sedation                            options and risks were discussed with the patient.                            All questions were answered, and informed consent                            was obtained. Prior Anticoagulants: The patient has                            taken no anticoagulant or antiplatelet agents. ASA                            Grade Assessment: II - A patient with mild systemic                            disease. After reviewing the risks and benefits,                            the patient was deemed in satisfactory condition to                            undergo the procedure.                           After obtaining informed consent, the colonoscope  was passed under direct vision. Throughout the                            procedure, the patient's blood pressure, pulse, and                            oxygen saturations were monitored continuously. The                            CF [id number] #[id number] was introduced through the anus                            and advanced to the the cecum, identified by                            appendiceal orifice and ileocecal valve.  The                            colonoscopy was somewhat difficult due to a                            tortuous colon and the patient's body habitus.                            Successful completion of the procedure was aided by                            applying abdominal pressure. The patient tolerated                            the procedure well. The quality of the bowel                            preparation was good except in the cecum where                            there was adherent stool which could not be fully                            washed despite aggressive suctioning and                            aspiration. The ileocecal valve, appendiceal                            orifice, and rectum were photographed. Scope In: 3:29:51 PM Scope Out: 3:56:32 PM Scope Withdrawal Time: 0 hours 16 minutes 10 seconds  Total Procedure Duration: 0 hours 26 minutes 41 seconds  Findings:                 Three sessile polyps were found in the proximal                            ascending colon. The polyps  were 6 to 12 mm in                            size. These polyps were removed with a hot snare x                            2, and smaller ones with cold snare. Resection and                            retrieval were complete.                           Five sessile polyps were found in the mid rectum,                            distal sigmoid colon, mid descending colon, mid                            transverse colon and distal transverse colon. The                            polyps were 6 to 8 mm in size. These polyps were                            removed with a cold snare. Resection and retrieval                            were complete.                           A few small-mouthed diverticula were found in the                            sigmoid colon.                           Non-bleeding internal hemorrhoids were found during                            retroflexion and during  perianal exam. The                            hemorrhoids were small and Grade I (internal                            hemorrhoids that do not prolapse).                           The exam was otherwise without abnormality on                            direct and retroflexion views. Complications:            No immediate complications. Estimated Blood Loss:     Estimated blood loss: none. Impression:               -  Three 6 to 12 mm polyps in the proximal ascending                            colon, removed with a hot snare. Resected and                            retrieved.                           - Five 6 to 8 mm polyps in the mid rectum, in the                            distal sigmoid colon, in the mid descending colon,                            in the mid transverse colon and in the distal                            transverse colon, removed with a cold snare.                            Resected and retrieved.                           - Mild sigmoid diverticulosis.                           - Non-bleeding internal hemorrhoids.                           - The examination was otherwise normal on direct                            and retroflexion views. Recommendation:           - Patient has a contact number available for                            emergencies. The signs and symptoms of potential                            delayed complications were discussed with the                            patient. Return to normal activities tomorrow.                            Written discharge instructions were provided to the                            patient.                           - Resume previous diet.                           -  Continue present medications.                           - No aspirin, ibuprofen, naproxen, or other                            non-steroidal anti-inflammatory drugs for 5 days                            after polyp removal.                           -  Await pathology results.                           - Repeat colonoscopy for surveillance based on                            pathology results.                           - The findings and recommendations were discussed                            with the patient's family. Lajuan Pila, MD 10/03/2023 4:03:41 PM This report has been signed electronically.

## 2023-10-03 NOTE — Progress Notes (Signed)
 Sedate, gd SR, tolerated procedure well, VSS, report to RN

## 2023-10-06 ENCOUNTER — Telehealth: Payer: Self-pay

## 2023-10-06 NOTE — Telephone Encounter (Signed)
  Follow up Call-     10/03/2023    3:05 PM  Call back number  Post procedure Call Back phone  # [phone]  Permission to leave phone message Yes     Patient questions:  Do you have a fever, pain , or abdominal swelling? No. Pain Score  0 *  Have you tolerated food without any problems? Yes.    Have you been able to return to your normal activities? Yes.    Do you have any questions about your discharge instructions: Diet   No. Medications  No. Follow up visit  No.  Do you have questions or concerns about your Care? No.  Actions: * If pain score is 4 or above: No action needed, pain <4.

## 2023-10-08 LAB — SURGICAL PATHOLOGY

## 2023-10-12 ENCOUNTER — Encounter: Payer: Self-pay | Admitting: Gastroenterology

## 2024-03-21 ENCOUNTER — Ambulatory Visit: Admission: EM | Admit: 2024-03-21 | Discharge: 2024-03-21 | Disposition: A | Discharge status: Home / Self Care

## 2024-03-21 ENCOUNTER — Ambulatory Visit (INDEPENDENT_AMBULATORY_CARE_PROVIDER_SITE_OTHER)

## 2024-03-21 DIAGNOSIS — M5441 Lumbago with sciatica, right side: Secondary | ICD-10-CM | POA: Diagnosis not present

## 2024-03-21 DIAGNOSIS — S39012A Strain of muscle, fascia and tendon of lower back, initial encounter: Secondary | ICD-10-CM

## 2024-03-21 MED ORDER — DEXAMETHASONE SOD PHOSPHATE PF 10 MG/ML IJ SOLN
10.0000 mg | Freq: Once | INTRAMUSCULAR | Status: AC
  Administered 2024-03-21: 10 mg via INTRAMUSCULAR

## 2024-03-21 MED ORDER — KETOROLAC TROMETHAMINE 30 MG/ML IJ SOLN
30.0000 mg | Freq: Once | INTRAMUSCULAR | Status: AC
  Administered 2024-03-21: 30 mg via INTRAMUSCULAR

## 2024-03-21 MED ORDER — PREDNISONE 20 MG PO TABS: 40.0000 mg | ORAL_TABLET | Freq: Every day | ORAL | 0 refills | Status: DC

## 2024-03-21 NOTE — ED Provider Notes (Signed)
 [facility]    CSN: [id number] Arrival date & time: 03/21/24  1131      History   Chief Complaint Chief Complaint  Patient presents with   Back Pain    HPI Richard Neal is a 68 y.o. male.   68 year old male presents urgent care with complaints of lower back pain with pain radiating down his right leg.  He reports that he was returning a toilet to [employer] today and when he lifted it up he developed pain.  He reports that the pain is very severe.  It is radiating down into his buttocks and down the right hip.  He denies any numbness or tingling.  He reports that the pain is over both sides of the back but that his right leg is someone that is causing the most problems with the radiating pain.  He has a history of having back surgery in the past and is concerned that he may have done damage.   Back Pain Associated symptoms: no abdominal pain, no chest pain, no dysuria and no fever     Past Medical History:  Diagnosis Date   Anxiety    Arthritis    degenerative disc disease lumbar   Depression     There are no active problems to display for this patient.   Past Surgical History:  Procedure Laterality Date   BACK SURGERY  2007 and 2010   FOOT SURGERY         Home Medications    Prior to Admission medications   Medication Sig Start Date End Date Taking? Authorizing Provider  amLODipine (NORVASC) 2.5 MG tablet Take 2.5 mg by mouth daily. 07/22/23  Yes [provider]  phentermine (ADIPEX-P) 37.5 MG tablet Take 37.5 mg by mouth daily. 03/01/24  Yes [provider]  rosuvastatin (CRESTOR) 20 MG tablet Take 20 mg by mouth at bedtime. 07/15/23  Yes [provider]  benzonatate  (TESSALON ) 100 MG capsule Take 1 capsule (100 mg total) by mouth every 8 (eight) hours as needed for cough. Patient not taking: Reported on 09/19/2023 08/09/21   Hazen Darryle BRAVO, FNP  EPINEPHrine  (EPIPEN  2-PAK) 0.3 mg/0.3 mL IJ SOAJ injection Inject 0.3 mLs (0.3 mg  total) into the muscle once. 11/26/14   Smith, Kristi M, MD  fluticasone  (FLONASE ) 50 MCG/ACT nasal spray Place 1 spray into both nostrils daily for 3 days. 08/09/21 08/12/21  Hazen Darryle BRAVO, FNP  ibuprofen (ADVIL) 200 MG tablet Take by mouth.    [provider]  predniSONE  (DELTASONE ) 20 MG tablet Take 2 tablets (40 mg total) by mouth daily with breakfast for 4 days. Start on 10/13 03/21/24 03/25/24 Yes Toron Bowring, Almarie LABOR, PA-C    Family History Family History  Problem Relation Age of Onset   Clotting disorder Mother    Liver disease Neg Hx    Colon cancer Neg Hx    Esophageal cancer Neg Hx     Social History Social History   Tobacco Use   Smoking status: Former    Current packs/day: 0.00    Types: Cigarettes    Quit date: 05/10/1999    Years since quitting: 24.8   Smokeless tobacco: Never  Vaping Use   Vaping status: Never Used  Substance Use Topics   Alcohol use: Yes    Alcohol/week: 0.0 standard drinks of alcohol    Comment: occasionally   Drug use: No     Allergies   Patient has no known allergies.   Review of  Systems Review of Systems  Constitutional:  Negative for chills and fever.  HENT:  Negative for ear pain and sore throat.   Eyes:  Negative for pain and visual disturbance.  Respiratory:  Negative for cough and shortness of breath.   Cardiovascular:  Negative for chest pain and palpitations.  Gastrointestinal:  Negative for abdominal pain and vomiting.  Genitourinary:  Negative for dysuria and hematuria.  Musculoskeletal:  Positive for back pain. Negative for arthralgias.  Skin:  Negative for color change and rash.  Neurological:  Negative for seizures and syncope.  All other systems reviewed and are negative.    Physical Exam Triage Vital Signs ED Triage Vitals  Encounter Vitals Group     BP 03/21/24 1244 133/80     Girls Systolic BP Percentile --      Girls Diastolic BP Percentile --      Boys Systolic BP Percentile --      Boys  Diastolic BP Percentile --      Pulse Rate 03/21/24 1244 81     Resp 03/21/24 1244 20     Temp 03/21/24 1244 98.5 F (36.9 C)     Temp Source 03/21/24 1244 Oral     SpO2 03/21/24 1244 96 %     Weight 03/21/24 1242 240 lb (108.9 kg)     Height 03/21/24 1242 5' 11 (1.803 m)     Head Circumference --      Peak Flow --      Pain Score 03/21/24 1238 10     Pain Loc --      Pain Education --      Exclude from Growth Chart --    No data found.  Updated Vital Signs BP 133/80 (BP Location: Left Arm)   Pulse 81   Temp 98.5 F (36.9 C) (Oral)   Resp 20   Ht 5' 11 (1.803 m)   Wt 240 lb (108.9 kg)   SpO2 96%   BMI 33.47 kg/m   Visual Acuity Right Eye Distance:   Left Eye Distance:   Bilateral Distance:    Right Eye Near:   Left Eye Near:    Bilateral Near:     Physical Exam Vitals and nursing note reviewed.  Constitutional:      General: He is not in acute distress.    Appearance: He is well-developed.  HENT:     Head: Normocephalic and atraumatic.  Eyes:     Conjunctiva/sclera: Conjunctivae normal.  Cardiovascular:     Rate and Rhythm: Normal rate and regular rhythm.     Heart sounds: No murmur heard. Pulmonary:     Effort: Pulmonary effort is normal. No respiratory distress.     Breath sounds: Normal breath sounds.  Abdominal:     Palpations: Abdomen is soft.     Tenderness: There is no abdominal tenderness.  Musculoskeletal:        General: No swelling.     Cervical back: Neck supple.     Lumbar back: Spasms and tenderness present. Decreased range of motion (Difficulty rising and lying down). Positive right straight leg raise test. Negative left straight leg raise test.  Skin:    General: Skin is warm and dry.     Capillary Refill: Capillary refill takes less than 2 seconds.  Neurological:     Mental Status: He is alert.  Psychiatric:        Mood and Affect: Mood normal.      UC Treatments / Results  Labs (  all labs ordered are listed, but only  abnormal results are displayed) Labs Reviewed - No data to display  EKG   Radiology DG Lumbar Spine Complete Result Date: 03/21/2024 CLINICAL DATA:  Lower back pain radiating into the right leg EXAM: LUMBAR SPINE - COMPLETE 5 VIEW COMPARISON:  Lumbar spine radiographs dated 03/19/2007, MRI lumbar spine dated 12/29/2007 FINDINGS: Unchanged vertebral body height loss of L4 and slightly increased vertebral body height loss of L2 since 2009. Alignment is normal. Mild intervertebral disc space narrowing at L4-5 and L5-S1. Facet arthropathy spanning L4-S1. IMPRESSION: 1. Unchanged vertebral body height loss of L4 and slightly increased vertebral body height loss of L2 since 2009. 2. Mild degenerative changes of the lower lumbar spine. Electronically Signed   By: Limin  Xu M.D.   On: 03/21/2024 13:41    Procedures Procedures (including critical care time)  Medications Ordered in UC Medications  ketorolac (TORADOL) 30 MG/ML injection 30 mg (30 mg Intramuscular Given 03/21/24 1333)  dexamethasone (DECADRON) injection 10 mg (10 mg Intramuscular Given 03/21/24 1333)    Initial Impression / Assessment and Plan / UC Course  I have reviewed the triage vital signs and the nursing notes.  Pertinent labs & imaging results that were available during my care of the patient were reviewed by me and considered in my medical decision making (see chart for details).     Acute bilateral low back pain with right-sided sciatica - Plan: DG Lumbar Spine Complete, DG Lumbar Spine Complete  Strain of lumbar region, initial encounter   X-ray of the lumbar spine done today.  Final evaluation by the radiologist does not show any acute changes from previous x-rays.  Symptoms and physical exam findings are most consistent with a muscular strain in the lower back as well as sciatica on the right.  We have treated in the office today with injections of pain medication as well as a steroid.  We will treat with the  following: Toradol injection given today. This is a medication to help with pain. This is not a narcotic.  Decadron injection given today. This is a steroid to help with inflammation and pain. Start 03/22/24 Prednisone  40 mg (2 tablets) once daily for 4 days. Take this in the morning.  This is a steroid to help with inflammation and pain. Light stretching to improve mobility May alternate heat and ice to help with your symptoms Activity as tolerated Return to urgent care or PCP if symptoms worsen or fail to resolve.    Final Clinical Impressions(s) / UC Diagnoses   Final diagnoses:  Acute bilateral low back pain with right-sided sciatica  Strain of lumbar region, initial encounter     Discharge Instructions      X-ray of the lumbar spine done today.  Final evaluation by the radiologist does not show any acute changes from previous x-rays.  Symptoms and physical exam findings are most consistent with a muscular strain in the lower back as well as sciatica on the right.  We have treated in the office today with injections of pain medication as well as a steroid.  We will treat with the following: Toradol injection given today. This is a medication to help with pain. This is not a narcotic.  Decadron injection given today. This is a steroid to help with inflammation and pain. Start 03/22/24 Prednisone  40 mg (2 tablets) once daily for 4 days. Take this in the morning.  This is a steroid to help with inflammation and pain. Light  stretching to improve mobility May alternate heat and ice to help with your symptoms Activity as tolerated Return to urgent care or PCP if symptoms worsen or fail to resolve.       ED Prescriptions     Medication Sig Dispense Auth. Provider   predniSONE  (DELTASONE ) 20 MG tablet Take 2 tablets (40 mg total) by mouth daily with breakfast for 4 days. Start on 10/13 8 tablet Teresa Almarie LABOR, [place]      PDMP not reviewed this encounter.   Teresa Almarie LABOR,  PA-C 03/21/24 1349

## 2024-03-21 NOTE — Discharge Instructions (Addendum)
 X-ray of the lumbar spine done today.  Final evaluation by the radiologist does not show any acute changes from previous x-rays.  Symptoms and physical exam findings are most consistent with a muscular strain in the lower back as well as sciatica on the right.  We have treated in the office today with injections of pain medication as well as a steroid.  We will treat with the following: Toradol injection given today. This is a medication to help with pain. This is not a narcotic.  Decadron injection given today. This is a steroid to help with inflammation and pain. Start 03/22/24 Prednisone  40 mg (2 tablets) once daily for 4 days. Take this in the morning.  This is a steroid to help with inflammation and pain. Light stretching to improve mobility May alternate heat and ice to help with your symptoms Activity as tolerated Return to urgent care or PCP if symptoms worsen or fail to resolve.

## 2024-03-21 NOTE — ED Triage Notes (Signed)
 Patient reports experiencing back pain after returning a box/toilet to [employer]. While unloading it from the truck, he felt immediate pain in the lower right side of the back. The patient has a history of two surgeries in or around that area and continues to experience pain, with radiation extending slightly upwards and down the right hip and leg.

## 2024-03-23 ENCOUNTER — Ambulatory Visit
Admission: EM | Admit: 2024-03-23 | Discharge: 2024-03-23 | Disposition: A | Discharge status: Home / Self Care | Attending: Family Medicine | Admitting: Family Medicine

## 2024-03-23 ENCOUNTER — Encounter: Payer: Self-pay | Admitting: Emergency Medicine

## 2024-03-23 DIAGNOSIS — M5441 Lumbago with sciatica, right side: Secondary | ICD-10-CM

## 2024-03-23 MED ORDER — CYCLOBENZAPRINE HCL 10 MG PO TABS: ORAL_TABLET | ORAL | 0 refills | Status: AC

## 2024-03-23 MED ORDER — DEXAMETHASONE SOD PHOSPHATE PF 10 MG/ML IJ SOLN
10.0000 mg | Freq: Once | INTRAMUSCULAR | Status: AC
  Administered 2024-03-23: 10 mg via INTRAMUSCULAR

## 2024-03-23 MED ORDER — HYDROCODONE-ACETAMINOPHEN 5-325 MG PO TABS: 1.0000 | ORAL_TABLET | Freq: Four times a day (QID) | ORAL | 0 refills | Status: AC | PRN

## 2024-03-23 MED ORDER — KETOROLAC TROMETHAMINE 30 MG/ML IJ SOLN
30.0000 mg | Freq: Once | INTRAMUSCULAR | Status: AC
  Administered 2024-03-23: 30 mg via INTRAMUSCULAR

## 2024-03-23 NOTE — ED Triage Notes (Signed)
 Pt presents c/o right hip and right leg pain. Pt states it is from the same injury on 03/21/24. Pt reports he received 2 inj on the 12th but the meds only helped for a few hours. Pt reports he is not sleeping and has a hard time with mobility.

## 2024-03-23 NOTE — Discharge Instructions (Addendum)
 Meds ordered this encounter  Medications   cyclobenzaprine (FLEXERIL) 10 MG tablet    Sig: Take 1 tablet by mouth before bed as needed for muscle spasm. Warning: May cause drowsiness.    Dispense:  10 tablet    Refill:  0   dexamethasone (DECADRON) injection 10 mg   ketorolac (TORADOL) 30 MG/ML injection 30 mg   HYDROcodone-acetaminophen (NORCO/VICODIN) 5-325 MG tablet    Sig: Take 1 tablet by mouth every 6 (six) hours as needed for moderate pain (pain score 4-6) or severe pain (pain score 7-10).    Dispense:  8 tablet    Refill:  0   Be aware, you have been prescribed pain medications that may cause drowsiness. While taking this medication, do not take any other medications containing acetaminophen (Tylenol). Do not combine with alcohol or recreational drugs. Please do not drive, operate heavy machinery, or take part in activities that require making important decisions while on this medication as your judgement may be clouded.

## 2024-03-23 NOTE — ED Provider Notes (Signed)
 [facility]   [id number] 03/23/24 Arrival Time: 1324  ASSESSMENT & PLAN:  1. Acute right-sided low back pain with right-sided sciatica    Able to ambulate here and hemodynamically stable. May need MRI. Trial of below first.  Meds ordered this encounter  Medications   cyclobenzaprine (FLEXERIL) 10 MG tablet    Sig: Take 1 tablet by mouth before bed as needed for muscle spasm. Warning: May cause drowsiness.    Dispense:  10 tablet    Refill:  0   dexamethasone (DECADRON) injection 10 mg   ketorolac (TORADOL) 30 MG/ML injection 30 mg   HYDROcodone-acetaminophen (NORCO/VICODIN) 5-325 MG tablet    Sig: Take 1 tablet by mouth every 6 (six) hours as needed for moderate pain (pain score 4-6) or severe pain (pain score 7-10).    Dispense:  8 tablet    Refill:  0   Medication sedation precautions given. Encourage ROM/movement as tolerated.  Recommend:  Follow-up Information     Schedule an appointment as soon as possible for a visit  with [facility].   Contact information: [address] [place] [address] [phone]        Schedule an appointment as soon as possible for a visit  with Maree Leni Edyth DELENA, MD.   Specialty: Family Medicine Why: For follow up. Contact information: [address] [place] [address] [phone]                 Reviewed expectations re: course of current medical issues. Questions answered. Outlined signs and symptoms indicating need for more acute intervention. Patient verbalized understanding. After Visit Summary given.   SUBJECTIVE: History from: patient. Seen here on 03/21/24; note reviewed. Pain still present and more persistent.  Richard Neal is a 68 y.o. male who presents with complaint of R low back pain; non-traumatic. Does radiated down R post leg with occas leg tingling. Denies bowel/bladder habit changes. Denies urinary retention. Pain noted after lifting a toilet a few d  ago.   OBJECTIVE:  Vitals:   03/23/24 1402 03/23/24 1403  BP:  (!) 166/81  Pulse:  93  Resp:  18  Temp:  98.6 F (37 C)  TempSrc:  Oral  SpO2:  97%  Weight: 108.9 kg     General appearance: alert; no distress HEENT: [place]; AT Neck: supple with FROM; without midline tenderness CV: regular Lungs: unlabored respirations; speaks full sentences without difficulty Abdomen: soft, non-tender; non-distended Back vague soreness over lower back on R reported; +SLR on R; FROM at waist but moves slowly; bruising: none; without midline tenderness Extremities: without edema; symmetrical without gross deformities; normal ROM of bilateral LE Skin: warm and dry Neurologic: normal gait; normal sensation and strength of bilateral LE; normal reflexes of bilateral LE Psychological: alert and cooperative; normal mood and affect   No Known Allergies  Past Medical History:  Diagnosis Date   Anxiety    Arthritis    degenerative disc disease lumbar   Depression    Social History   Socioeconomic History   Marital status: Married    Spouse name: Not on file   Number of children: 1   Years of education: Not on file   Highest education level: Not on file  Occupational History   Not on file  Tobacco Use   Smoking status: Former    Current packs/day: 0.00    Types: Cigarettes    Quit date: 05/10/1999    Years since quitting: 24.8  Passive exposure: Past   Smokeless tobacco: Never  Vaping Use   Vaping status: Never Used  Substance and Sexual Activity   Alcohol use: Yes    Alcohol/week: 0.0 standard drinks of alcohol    Comment: occasionally   Drug use: No   Sexual activity: Yes    Birth control/protection: None  Other Topics Concern   Not on file  Social History Narrative   Not on file   Social Drivers of Health   Financial Resource Strain: Not on file  Food Insecurity: Not on file  Transportation Needs: Not on file  Physical Activity: Not on file  Stress: Not on file   Social Connections: Unknown (08/06/2022)   Received [facility]   Social Network    Social Network: Not on file  Intimate Partner Violence: Unknown (08/06/2022)   Received from [facility]   HITS    Physically Hurt: Not on file    Insult or Talk Down To: Not on file    Threaten Physical Harm: Not on file    Scream or Curse: Not on file   Family History  Problem Relation Age of Onset   Clotting disorder Mother    Liver disease Neg Hx    Colon cancer Neg Hx    Esophageal cancer Neg Hx    Past Surgical History:  Procedure Laterality Date   BACK SURGERY  2007 and 2010   FOOT SURGERY        Rolinda Rogue, MD 03/23/24 1504

## 2024-03-26 ENCOUNTER — Emergency Department (HOSPITAL_COMMUNITY)
Admission: EM | Admit: 2024-03-26 | Discharge: 2024-03-26 | Disposition: A | Discharge status: Home / Self Care | Attending: Emergency Medicine | Admitting: Emergency Medicine

## 2024-03-26 ENCOUNTER — Other Ambulatory Visit: Payer: Self-pay

## 2024-03-26 ENCOUNTER — Encounter (HOSPITAL_COMMUNITY): Payer: Self-pay | Admitting: Emergency Medicine

## 2024-03-26 DIAGNOSIS — X500XXA Overexertion from strenuous movement or load, initial encounter: Secondary | ICD-10-CM | POA: Diagnosis not present

## 2024-03-26 DIAGNOSIS — M5441 Lumbago with sciatica, right side: Secondary | ICD-10-CM | POA: Diagnosis not present

## 2024-03-26 DIAGNOSIS — S39012A Strain of muscle, fascia and tendon of lower back, initial encounter: Secondary | ICD-10-CM | POA: Insufficient documentation

## 2024-03-26 DIAGNOSIS — M545 Low back pain, unspecified: Secondary | ICD-10-CM | POA: Diagnosis present

## 2024-03-26 DIAGNOSIS — T148XXA Other injury of unspecified body region, initial encounter: Secondary | ICD-10-CM

## 2024-03-26 MED ORDER — KETOROLAC TROMETHAMINE 30 MG/ML IJ SOLN
30.0000 mg | Freq: Once | INTRAMUSCULAR | Status: AC
  Administered 2024-03-26: 30 mg via INTRAMUSCULAR
  Filled 2024-03-26: qty 1

## 2024-03-26 MED ORDER — OXYCODONE-ACETAMINOPHEN 5-325 MG PO TABS: 1.0000 | ORAL_TABLET | Freq: Four times a day (QID) | ORAL | 0 refills | Status: AC | PRN

## 2024-03-26 MED ORDER — PREDNISONE 10 MG PO TABS: 60.0000 mg | ORAL_TABLET | Freq: Every day | ORAL | 0 refills | Status: AC

## 2024-03-26 MED ORDER — OXYCODONE-ACETAMINOPHEN 5-325 MG PO TABS
1.0000 | ORAL_TABLET | Freq: Once | ORAL | Status: AC
  Administered 2024-03-26: 1 via ORAL
  Filled 2024-03-26: qty 1

## 2024-03-26 MED ORDER — LIDOCAINE 5 % EX PTCH
1.0000 | MEDICATED_PATCH | CUTANEOUS | Status: DC
  Administered 2024-03-26: 1 via TRANSDERMAL
  Filled 2024-03-26: qty 1

## 2024-03-26 NOTE — Discharge Instructions (Signed)
 Thank you for letting us  evaluate you today.  Please try and call MRI scheduling system to move MRI appointment forward or [facility] for MRI as you may have experienced herniated disc, ruptured disc.  I have sent prednisone  and narcotic pain medicine to your pharmacy for continued pain relief and anti-inflammatory.  Return to Emergency Department for emergent MRI if you experience loss of sensation in genital region, loss of continence of urine where you pee on yourself

## 2024-03-26 NOTE — ED Provider Notes (Signed)
 [place] [facility] Provider Note   CSN: [id number] Arrival date & time: 03/26/24  1315     Patient presents with: Back Pain   Richard Neal is a 68 y.o. male with no pertinent past medical history presents emergency department for evaluation of lower back pain that radiates into right leg following injury last week.  He was attempting to take a toilet out from his truck when he accidentally dropped it.  Was seen at urgent care 3 days ago and provided muscle relaxer, Percocet prescription with no improvement to pain.  Has MRI scheduled for next Friday in 1 week however cannot Emergency Department as he is having difficulty sleeping, performing ADLs secondary to pain.  Denies urinary symptoms, urinary incontinence, saddle paresthesia, history of IVDU or malignancy, saddle paresthesia     Back Pain      Prior to Admission medications   Medication Sig Start Date End Date Taking? Authorizing Provider  oxyCODONE-acetaminophen (PERCOCET/ROXICET) 5-325 MG tablet Take 1 tablet by mouth every 6 (six) hours as needed for severe pain (pain score 7-10). 03/26/24  Yes Zelaya, Oscar A, PA-C  predniSONE  (DELTASONE ) 10 MG tablet Take 6 tablets (60 mg total) by mouth daily for 5 days. 03/26/24 03/31/24 Yes Minnie Tinnie BRAVO, PA  amLODipine (NORVASC) 2.5 MG tablet Take 2.5 mg by mouth daily. 07/22/23   [provider]  benzonatate  (TESSALON ) 100 MG capsule Take 1 capsule (100 mg total) by mouth every 8 (eight) hours as needed for cough. Patient not taking: Reported on 09/19/2023 08/09/21   Hazen Darryle BRAVO, FNP  cyclobenzaprine (FLEXERIL) 10 MG tablet Take 1 tablet by mouth before bed as needed for muscle spasm. Warning: May cause drowsiness. 03/23/24   Rolinda Rogue, MD  EPINEPHrine  (EPIPEN  2-PAK) 0.3 mg/0.3 mL IJ SOAJ injection Inject 0.3 mLs (0.3 mg total) into the muscle once. 11/26/14   Smith, Kristi M, MD  fluticasone  (FLONASE ) 50 MCG/ACT nasal spray Place  1 spray into both nostrils daily for 3 days. 08/09/21 08/12/21  Mound, Haley E, FNP  HYDROcodone-acetaminophen (NORCO/VICODIN) 5-325 MG tablet Take 1 tablet by mouth every 6 (six) hours as needed for moderate pain (pain score 4-6) or severe pain (pain score 7-10). 03/23/24   Rolinda Rogue, MD  ibuprofen (ADVIL) 200 MG tablet Take by mouth.    [provider]  phentermine (ADIPEX-P) 37.5 MG tablet Take 37.5 mg by mouth daily. 03/01/24   [provider]  rosuvastatin (CRESTOR) 20 MG tablet Take 20 mg by mouth at bedtime. 07/15/23   [provider]    Allergies: Patient has no known allergies.    Review of Systems  Musculoskeletal:  Positive for back pain.    Updated Vital Signs BP (!) 160/74   Pulse 72   Temp 98.2 F (36.8 C) (Oral)   Resp 17   Ht 5' 11 (1.803 m)   Wt 108.9 kg   SpO2 96%   BMI 33.47 kg/m   Physical Exam Vitals and nursing note reviewed.  Constitutional:      General: He is not in acute distress.    Appearance: Normal appearance.  HENT:     Head: Normocephalic and atraumatic.  Eyes:     Conjunctiva/sclera: Conjunctivae normal.  Cardiovascular:     Rate and Rhythm: Normal rate.  Pulmonary:     Effort: Pulmonary effort is normal. No respiratory distress.     Breath sounds: Normal breath sounds.  Musculoskeletal:     Cervical back:  No bony tenderness.     Thoracic back: No bony tenderness.     Lumbar back: Tenderness present. No bony tenderness. Positive right straight leg raise test.     Right lower leg: No edema.     Left lower leg: No edema.     Comments: No ecchymosis to back.  No crepitus, step-off, deformity to spine  Skin:    Coloration: Skin is not jaundiced or pale.  Neurological:     Mental Status: He is alert and oriented to person, place, and time. Mental status is at baseline.     Comments: Sensation 2/2 of BLE.     (all labs ordered are listed, but only abnormal results are displayed) Labs Reviewed - No data to  display  EKG: None  Radiology: No results found.   Medications Ordered in the ED  ketorolac (TORADOL) 30 MG/ML injection 30 mg (30 mg Intramuscular Given 03/26/24 1712)  oxyCODONE-acetaminophen (PERCOCET/ROXICET) 5-325 MG per tablet 1 tablet (1 tablet Oral Given 03/26/24 1711)                                    Medical Decision Making Risk Prescription drug management.   Patient presents to the ED for concern of back pain, this involves an extensive number of treatment options, and is a complaint that carries with it a high risk of complications and morbidity.  The differential diagnosis includes kidney stone, UTI, cauda equina, muscle strain, sciatica, epidural abscess   Co morbidities that complicate the patient evaluation  None   Additional history obtained:  Additional history obtained from Nursing   External records from outside source obtained and reviewed including triage note, urgent care note     Medicines ordered and prescription drug management:  I ordered medication including Toradol, lidocaine patch, Percocet, prednisone  for back pain Reevaluation of the patient after these medicines showed that the patient improved I have reviewed the patients home medicines and have made adjustments as needed    Problem List / ED Course:  Low back pain with right-sided sciatica Vital signs hemodynamically stable.  No fever. Continues to not have red flag signs such as urinary incontinence, saddle paresthesia.  No history of IVDU or malignancy.  Low suspicion for cauda equina, epidural abscess Sensation 2/2 of BLE Low suspicion for kidney stone as he has no history of kidney stones.  No complaints of hematuria Low suspicion for UTI as he has no urinary symptoms MRI is not available following 5:00 so was unable to have MRI today.  Fortunately, he has no red flag symptoms.  He does not require transfer to [facility] for MRI.  He can follow-up with MRI scheduled in 1  week.  I did discuss that he can call to see if they can move up appointment as he has had 2 additional emergency visits due to pain and appears to be in pain.  Patient reports that he will follow-up with [facility] to see if they can provide him with MRI, further assistance He does have more muscle relaxer at home and does not require a new prescription.  I did refill his Percocet as he only had 8 tablets of those following urgent care.  I did provide him with prednisone   Reevaluation:  After the interventions noted above, I reevaluated the patient and found that they have :improved     Dispostion:  After consideration of the diagnostic results and  the patients response to treatment, I feel that the patent would benefit from patient management with MRI follow-up.   Discussed ED workup, disposition, return to ED precautions with patient who expresses understanding agrees with plan.  All questions answered to their satisfaction.  They are agreeable to plan.  Discharge instructions provided on paperwork  Final diagnoses:  Acute low back pain with right-sided sciatica, unspecified back pain laterality  Muscle strain    ED Discharge Orders          Ordered    predniSONE  (DELTASONE ) 10 MG tablet  Daily        03/26/24 1700    oxyCODONE-acetaminophen (PERCOCET/ROXICET) 5-325 MG tablet  Every 6 hours PRN        03/26/24 1729             Minnie Tinnie BRAVO, PA 03/26/24 2334    Randol Simmonds, MD 03/27/24 [phone]

## 2024-03-26 NOTE — ED Triage Notes (Signed)
 Pt with right lower back pain radiating down leg to right foot.  Has not been able to sleep d/t pain.
